# Patient Record
Sex: Female | Born: 1985
Health system: Southern US, Community
[De-identification: ages and names within clinical notes are randomized; demographics above are authoritative.]

## PROBLEM LIST (undated history)

## (undated) DIAGNOSIS — Z9189 Other specified personal risk factors, not elsewhere classified: Secondary | ICD-10-CM

## (undated) DIAGNOSIS — G35 Multiple sclerosis: Secondary | ICD-10-CM

## (undated) DIAGNOSIS — Z1379 Encounter for other screening for genetic and chromosomal anomalies: Secondary | ICD-10-CM

## (undated) DIAGNOSIS — Z9289 Personal history of other medical treatment: Secondary | ICD-10-CM

## (undated) DIAGNOSIS — Z800 Family history of malignant neoplasm of digestive organs: Secondary | ICD-10-CM

## (undated) HISTORY — DX: Personal history of other medical treatment: Z92.89

## (undated) HISTORY — DX: Encounter for other screening for genetic and chromosomal anomalies: Z13.79

## (undated) HISTORY — DX: Family history of malignant neoplasm of digestive organs: Z80.0

## (undated) HISTORY — DX: Other specified personal risk factors, not elsewhere classified: Z91.89

## (undated) HISTORY — PX: FOOT SURGERY: SHX648

---
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
## 2008-10-18 ENCOUNTER — Emergency Department: Payer: Self-pay | Admitting: Emergency Medicine

## 2013-08-24 ENCOUNTER — Ambulatory Visit: Payer: Self-pay | Admitting: Podiatry

## 2013-09-01 ENCOUNTER — Ambulatory Visit: Payer: Self-pay | Admitting: Podiatry

## 2013-09-22 ENCOUNTER — Ambulatory Visit: Payer: Self-pay | Admitting: Podiatry

## 2013-09-26 ENCOUNTER — Ambulatory Visit: Payer: Self-pay | Admitting: Podiatry

## 2013-12-01 ENCOUNTER — Emergency Department: Payer: Self-pay | Admitting: Emergency Medicine

## 2014-08-03 DIAGNOSIS — G35A Relapsing-remitting multiple sclerosis: Secondary | ICD-10-CM | POA: Insufficient documentation

## 2014-08-03 DIAGNOSIS — K5900 Constipation, unspecified: Secondary | ICD-10-CM | POA: Insufficient documentation

## 2014-08-03 DIAGNOSIS — G35 Multiple sclerosis: Secondary | ICD-10-CM | POA: Insufficient documentation

## 2014-09-19 DIAGNOSIS — R195 Other fecal abnormalities: Secondary | ICD-10-CM | POA: Insufficient documentation

## 2014-09-19 DIAGNOSIS — Z800 Family history of malignant neoplasm of digestive organs: Secondary | ICD-10-CM | POA: Insufficient documentation

## 2014-09-19 DIAGNOSIS — R194 Change in bowel habit: Secondary | ICD-10-CM | POA: Insufficient documentation

## 2015-05-10 ENCOUNTER — Encounter: Payer: Self-pay | Admitting: Urgent Care

## 2015-05-10 ENCOUNTER — Emergency Department
Admission: EM | Admit: 2015-05-10 | Discharge: 2015-05-10 | Disposition: A | Discharge status: Home / Self Care | Payer: 59 | Attending: Emergency Medicine | Admitting: Emergency Medicine

## 2015-05-10 DIAGNOSIS — T7840XA Allergy, unspecified, initial encounter: Secondary | ICD-10-CM | POA: Insufficient documentation

## 2015-05-10 DIAGNOSIS — X58XXXA Exposure to other specified factors, initial encounter: Secondary | ICD-10-CM | POA: Insufficient documentation

## 2015-05-10 DIAGNOSIS — Y9289 Other specified places as the place of occurrence of the external cause: Secondary | ICD-10-CM | POA: Diagnosis not present

## 2015-05-10 DIAGNOSIS — Z8669 Personal history of other diseases of the nervous system and sense organs: Secondary | ICD-10-CM | POA: Diagnosis not present

## 2015-05-10 DIAGNOSIS — R21 Rash and other nonspecific skin eruption: Secondary | ICD-10-CM | POA: Diagnosis present

## 2015-05-10 DIAGNOSIS — Y998 Other external cause status: Secondary | ICD-10-CM | POA: Diagnosis not present

## 2015-05-10 DIAGNOSIS — Y9389 Activity, other specified: Secondary | ICD-10-CM | POA: Insufficient documentation

## 2015-05-10 DIAGNOSIS — L500 Allergic urticaria: Secondary | ICD-10-CM | POA: Diagnosis not present

## 2015-05-10 DIAGNOSIS — L509 Urticaria, unspecified: Secondary | ICD-10-CM

## 2015-05-10 HISTORY — DX: Multiple sclerosis: G35

## 2015-05-10 MED ORDER — FAMOTIDINE 20 MG PO TABS
40.0000 mg | ORAL_TABLET | Freq: Once | ORAL | Status: AC
  Administered 2015-05-10: 40 mg via ORAL

## 2015-05-10 MED ORDER — FAMOTIDINE 20 MG PO TABS
ORAL_TABLET | ORAL | Status: AC
  Administered 2015-05-10: 40 mg via ORAL
  Filled 2015-05-10: qty 2

## 2015-05-10 MED ORDER — PREDNISONE 20 MG PO TABS
ORAL_TABLET | ORAL | Status: AC
  Administered 2015-05-10: 60 mg via ORAL
  Filled 2015-05-10: qty 3

## 2015-05-10 MED ORDER — PREDNISONE 20 MG PO TABS
60.0000 mg | ORAL_TABLET | Freq: Once | ORAL | Status: AC
  Administered 2015-05-10: 60 mg via ORAL

## 2015-05-10 MED ORDER — METHYLPREDNISOLONE 4 MG PO TBPK: ORAL_TABLET | ORAL | Status: DC

## 2015-05-10 MED ORDER — DIPHENHYDRAMINE HCL 25 MG PO CAPS
ORAL_CAPSULE | ORAL | Status: AC
  Filled 2015-05-10: qty 2

## 2015-05-10 MED ORDER — FAMOTIDINE 20 MG PO TABS: 20.0000 mg | ORAL_TABLET | Freq: Two times a day (BID) | ORAL | Status: DC

## 2015-05-10 MED ORDER — DIPHENHYDRAMINE HCL 50 MG PO CAPS: 50.0000 mg | ORAL_CAPSULE | Freq: Once | ORAL | Status: DC

## 2015-05-10 NOTE — ED Notes (Signed)
Patient presents with c/o a non-specific rash to her BLE. Patient advising that she appreciated some "bumps" yesterday afternoon around 1600, however didn't think anything of it until she woke up itching. NOS reported by patient at this time.

## 2015-05-10 NOTE — ED Notes (Signed)
Pt has hives on her thigh itching had 2 bumps yesterday got worse tonight at 1am. Horrible itching. No change in anything to explain rash.

## 2015-05-10 NOTE — ED Provider Notes (Signed)
[facility] Emergency Department Provider Note  ____________________________________________  Time seen: Approximately 6:37 AM  I have reviewed the triage vital signs and the nursing notes.   HISTORY  Chief Complaint Rash   HPI Brandy Krause is a 29 y.o. female , this morning with itching and found that she was covered in hives. Patient denies any difficulty swallowing or difficulty breathing. Patient denies any known new foods or medications or toiletries. Patient also says that since she's been in the ER, hives have gotten some better. Patient did not take any medication at home for the symptoms. Patient is not having any significant pain or any other associated symptoms.   Past Medical History  Diagnosis Date  . MS (multiple sclerosis)     There are no active problems to display for this patient.   History reviewed. No pertinent past surgical history.  Current Outpatient Rx  Name  Route  Sig  Dispense  Refill  . famotidine (PEPCID) 20 MG tablet   Oral   Take 1 tablet (20 mg total) by mouth 2 (two) times daily.   10 tablet   1   . methylPREDNISolone (MEDROL DOSEPAK) 4 MG TBPK tablet      Reaction   21 tablet   0     Allergies Review of patient's allergies indicates no known allergies.  No family history on file.  Social History History  Substance Use Topics  . Smoking status: Never Smoker   . Smokeless tobacco: Not on file  . Alcohol Use: No    Review of Systems Constitutional: No fever/chills Eyes: No visual changes. ENT: No sore throat. Cardiovascular: Denies chest pain. Respiratory: Denies shortness of breath. Gastrointestinal: No abdominal pain.  No nausea, no vomiting.  No diarrhea.  No constipation. Genitourinary: Negative for dysuria. Musculoskeletal: Negative for back pain. Skin: Patient with hives scattered on her trunk and extremities. Neurological: Negative for headaches, focal weakness or numbness.  10-point  ROS otherwise negative.  ____________________________________________   PHYSICAL EXAM:  VITAL SIGNS: ED Triage Vitals  Enc Vitals Group     BP 05/10/15 0311 134/97 mmHg     Pulse Rate 05/10/15 0311 93     Resp 05/10/15 0311 18     Temp 05/10/15 0311 98.6 F (37 C)     Temp Source 05/10/15 0311 Oral     SpO2 05/10/15 0311 100 %     Weight 05/10/15 0311 160 lb (72.576 kg)     Height 05/10/15 0311 5\' 5"  (1.651 m)     Head Cir --      Peak Flow --      Pain Score 05/10/15 0312 0     Pain Loc --      Pain Edu? --      Excl. in [place]? --     Constitutional: Alert and oriented. Well appearing and in no acute distress. Eyes: Conjunctivae are normal. PERRL. EOMI. Head: Atraumatic. Nose: No congestion/rhinnorhea. Mouth/Throat: Mucous membranes are moist.  Oropharynx non-erythematous. There is no posterior oropharyngeal swelling Neck: No stridor.   Cardiovascular: Normal rate, regular rhythm. Grossly normal heart sounds.  Good peripheral circulation. Respiratory: Normal respiratory effort.  No retractions. Lungs CTAB. Gastrointestinal: Soft and nontender. No distention. No abdominal bruits. No CVA tenderness. Musculoskeletal: No lower extremity tenderness nor edema.  No joint effusions. Neurologic:  Normal speech and language. No gross focal neurologic deficits are appreciated. Speech is normal. No gait instability. Skin:  Skin is warm, dry and intact. Scattered hives  primarily on her trunk but a few scattered on her upper thighs and forearms. Psychiatric: Mood and affect are normal. Speech and behavior are normal.  ____________________________________________   LABS (all labs ordered are listed, but only abnormal results are displayed)  Labs Reviewed - No data to display ____________________________________________  EKG  None ____________________________________________  RADIOLOGY  None ____________________________________________   PROCEDURES  Procedure(s) performed:  None  Critical Care performed: No  ____________________________________________   INITIAL IMPRESSION / ASSESSMENT AND PLAN / ED COURSE  Pertinent labs & imaging results that were available during my care of the patient were reviewed by me and considered in my medical decision making (see chart for details).  Patient will be given some Benadryl, prednisone and Pepcid prior to discharge. Patient has been using over-the-counter Benadryl every 4-6 hours as needed but will be given a Medrol Dosepak along with Pepcid for 5 days. Patient is to return immediately if condition worsens. ____________________________________________   FINAL CLINICAL IMPRESSION(S) / ED DIAGNOSES  Final diagnoses:  Hives  Allergic reaction, initial encounter      Ruby Cola, MD 05/10/15 [id number]

## 2016-05-17 DIAGNOSIS — Z1371 Encounter for nonprocreative screening for genetic disease carrier status: Secondary | ICD-10-CM

## 2016-05-17 DIAGNOSIS — Z9189 Other specified personal risk factors, not elsewhere classified: Secondary | ICD-10-CM

## 2016-05-17 HISTORY — DX: Encounter for nonprocreative screening for genetic disease carrier status: Z13.71

## 2016-05-17 HISTORY — DX: Other specified personal risk factors, not elsewhere classified: Z91.89

## 2016-06-30 DIAGNOSIS — Z800 Family history of malignant neoplasm of digestive organs: Secondary | ICD-10-CM

## 2016-06-30 HISTORY — DX: Family history of malignant neoplasm of digestive organs: Z80.0

## 2017-06-11 ENCOUNTER — Encounter: Payer: Self-pay | Admitting: Emergency Medicine

## 2017-06-11 ENCOUNTER — Ambulatory Visit
Admission: EM | Admit: 2017-06-11 | Discharge: 2017-06-11 | Disposition: A | Discharge status: Home / Self Care | Payer: 59 | Attending: Emergency Medicine | Admitting: Emergency Medicine

## 2017-06-11 DIAGNOSIS — F419 Anxiety disorder, unspecified: Secondary | ICD-10-CM | POA: Diagnosis not present

## 2017-06-11 DIAGNOSIS — R41840 Attention and concentration deficit: Secondary | ICD-10-CM

## 2017-06-11 HISTORY — DX: Multiple sclerosis: G35

## 2017-06-11 MED ORDER — CLONAZEPAM 0.5 MG PO TABS: 0.5000 mg | ORAL_TABLET | Freq: Two times a day (BID) | ORAL | 0 refills | Status: DC | PRN

## 2017-06-11 NOTE — ED Provider Notes (Signed)
CSN: [id number]     Arrival date & time 06/11/17  1716 History   First MD Initiated Contact with Patient 06/11/17 1806     Chief Complaint  Patient presents with  . Anxiety   (Consider location/radiation/quality/duration/timing/severity/associated sxs/prior Treatment) HPI  This is a 31 year old female presents with feelings of anxiousness and difficulty focusing. States that she has had to drop out of several classes cut she cannot focus on the work and also does not adhere to deadlines well. She states that she will block out conversations; she is unable to concentrate exactly what the person is saying. She  does not have depression ,denies any thoughts of self-harm or homicide. She appears anxious during the interview. He is upset  with herself over her inability to take classes in order to improve herself. He is in a monogamous steady relationship. Is not having troubles at home. She has a history of multiple sclerosis and was on treatment mostly taking it when she had relapses. She discontinued the medication about 10 years ago.       Past Medical History:  Diagnosis Date  . MS (multiple sclerosis) ([place])   . Multiple sclerosis ([place])    Past Surgical History:  Procedure Laterality Date  . WISDOM TOOTH EXTRACTION     Family History  Problem Relation Age of Onset  . Cancer Mother   . Cancer Father    Social History  Substance Use Topics  . Smoking status: Never Smoker  . Smokeless tobacco: Never Used  . Alcohol use No   OB History    No data available     Review of Systems  Constitutional: Positive for activity change. Negative for chills, fatigue and fever.  Psychiatric/Behavioral: Positive for decreased concentration. Negative for self-injury, sleep disturbance and suicidal ideas. The patient is nervous/anxious and is hyperactive.   All other systems reviewed and are negative.   Allergies  Patient has no known allergies.  Home Medications   Prior to Admission  medications   Medication Sig Start Date End Date Taking? Authorizing Provider  levonorgestrel (MIRENA) 20 MCG/24HR IUD 1 each by Intrauterine route once.   Yes [provider]  clonazePAM (KLONOPIN) 0.5 MG tablet Take 1 tablet (0.5 mg total) by mouth 2 (two) times daily as needed for anxiety. 06/11/17   Lorin Picket, PA-C   Meds Ordered and Administered this Visit  Medications - No data to display  BP 131/89 (BP Location: Right Arm)   Pulse 80   Temp 98.2 F (36.8 C) (Oral)   Resp 16   Ht 5\' 5"  (1.651 m)   Wt 170 lb (77.1 kg)   SpO2 100%   BMI 28.29 kg/m  No data found.   Physical Exam  Constitutional: She is oriented to person, place, and time. She appears well-developed and well-nourished. No distress.  HENT:  Head: Normocephalic.  Eyes: Pupils are equal, round, and reactive to light. Right eye exhibits no discharge. Left eye exhibits no discharge.  Neck: Normal range of motion.  Cardiovascular: Normal rate and regular rhythm.   Pulmonary/Chest: Effort normal and breath sounds normal.  Musculoskeletal: Normal range of motion.  Neurological: She is alert and oriented to person, place, and time.  Skin: Skin is warm and dry. She is not diaphoretic.  Psychiatric: She has a normal mood and affect. Her behavior is normal. Judgment and thought content normal.  Nursing note and vitals reviewed.   Urgent Care Course     Procedures (including critical care time)  Labs Review Labs Reviewed - No data to display  Imaging Review No results found.   Visual Acuity Review  Right Eye Distance:   Left Eye Distance:   Bilateral Distance:    Right Eye Near:   Left Eye Near:    Bilateral Near:         MDM   1. Anxiety    Discharge Medication List as of 06/11/2017  6:05 PM    START taking these medications   Details  clonazePAM (KLONOPIN) 0.5 MG tablet Take 1 tablet (0.5 mg total) by mouth 2 (two) times daily as needed for anxiety., Starting Thu 06/11/2017,  Print      Plan: 1. Test/x-ray results and diagnosis reviewed with patient 2. rx as per orders; risks, benefits, potential side effects reviewed with patient 3. Recommend supportive treatment with Use of medication only as necessary. I have recommended that she follow-up with a primary care physician I for ongoing evaluation and treatment. Will require repeated close follow-ups which we do not provide here. I've given her the name of a primary care physician  in the area but have encouraged her to contact her insurance company to see if they are participant in her insurance. 4. F/u prn if symptoms worsen or don't improve     Lorin Picket, PA-C 06/11/17 1829

## 2017-06-11 NOTE — ED Triage Notes (Signed)
Patient c/o feeling anxious and having trouble focusing that has gotten worse over the past month.

## 2017-07-16 ENCOUNTER — Encounter: Payer: Self-pay | Admitting: Family Medicine

## 2017-07-16 ENCOUNTER — Ambulatory Visit (INDEPENDENT_AMBULATORY_CARE_PROVIDER_SITE_OTHER): Payer: 59 | Admitting: Family Medicine

## 2017-07-16 ENCOUNTER — Telehealth: Payer: Self-pay

## 2017-07-16 VITALS — BP 96/58 | HR 95 | Temp 98.2°F | Resp 16 | Ht 65.0 in | Wt 182.0 lb

## 2017-07-16 DIAGNOSIS — F419 Anxiety disorder, unspecified: Secondary | ICD-10-CM | POA: Diagnosis not present

## 2017-07-16 DIAGNOSIS — F900 Attention-deficit hyperactivity disorder, predominantly inattentive type: Secondary | ICD-10-CM

## 2017-07-16 DIAGNOSIS — F329 Major depressive disorder, single episode, unspecified: Secondary | ICD-10-CM

## 2017-07-16 DIAGNOSIS — F32A Depression, unspecified: Secondary | ICD-10-CM

## 2017-07-16 MED ORDER — SERTRALINE HCL 50 MG PO TABS: 50.0000 mg | ORAL_TABLET | Freq: Every day | ORAL | 3 refills | Status: DC

## 2017-07-16 NOTE — Telephone Encounter (Signed)
Please set up referral for patient to see psychiatrist per Dr. Ronnald Ramp request.

## 2017-07-16 NOTE — Progress Notes (Signed)
Name: Brandy Krause   MRN: [id number]    DOB: September 20, 1986   Date:07/16/2017       Progress Note  Subjective  Chief Complaint  Chief Complaint  Patient presents with  . Establish Care  . concentration    trouble focuing  . Depression    Patient with concerns of ADHD. Concerns about inability to concentrate and complete cognitive task.  Possible hyperteactive.  Questionable life goals and future aspiration.  Start courses and not complete.   Depression         This is a chronic problem.  The current episode started more than 1 year ago.   The onset quality is sudden.   The problem occurs intermittently.  The problem has been gradually worsening since onset.  Associated symptoms include decreased concentration, fatigue and decreased interest.  Associated symptoms include no helplessness, no hopelessness, does not have insomnia, not irritable, no restlessness, no appetite change, no myalgias, no headaches, not sad and no suicidal ideas.     The symptoms are aggravated by family issues.  Past treatments include nothing.  Past medical history includes anxiety.     Pertinent negatives include no physical disability, no eating disorder, no mental health disorder, no obsessive-compulsive disorder and no post-traumatic stress disorder. Anxiety  Presents for follow-up visit. Symptoms include decreased concentration and nervous/anxious behavior. Patient reports no chest pain, compulsions, confusion, depressed mood, dizziness, dry mouth, excessive worry, feeling of choking, impotence, insomnia, irritability, malaise, nausea, obsessions, palpitations, panic, restlessness, shortness of breath or suicidal ideas. Symptoms occur occasionally. The severity of symptoms is moderate. The quality of sleep is good.      No problem-specific Assessment & Plan notes found for this encounter.   Past Medical History:  Diagnosis Date  . MS (multiple sclerosis) ([place])   . Multiple sclerosis ([place])     Past Surgical  History:  Procedure Laterality Date  . WISDOM TOOTH EXTRACTION      Family History  Problem Relation Age of Onset  . Cancer Mother   . Cancer Father     Social History   Social History  . Marital status: Single    Spouse name: N/A  . Number of children: 0  . Years of education: N/A   Occupational History  . Shipper    Social History Main Topics  . Smoking status: Never Smoker  . Smokeless tobacco: Never Used  . Alcohol use No  . Drug use: No  . Sexual activity: Yes    Birth control/ protection: IUD   Other Topics Concern  . Not on file   Social History Narrative  . No narrative on file    No Known Allergies  Outpatient Medications Prior to Visit  Medication Sig Dispense Refill  . levonorgestrel (MIRENA) 20 MCG/24HR IUD 1 each by Intrauterine route once.    . clonazePAM (KLONOPIN) 0.5 MG tablet Take 1 tablet (0.5 mg total) by mouth 2 (two) times daily as needed for anxiety. 14 tablet 0   No facility-administered medications prior to visit.     Review of Systems  Constitutional: Positive for fatigue. Negative for appetite change, chills, fever, irritability, malaise/fatigue and weight loss.  HENT: Negative for ear discharge, ear pain and sore throat.   Eyes: Negative for blurred vision.  Respiratory: Negative for cough, sputum production, shortness of breath and wheezing.   Cardiovascular: Negative for chest pain, palpitations and leg swelling.  Gastrointestinal: Negative for abdominal pain, blood in stool, constipation, diarrhea, heartburn, melena and nausea.  Genitourinary: Negative for dysuria, frequency, hematuria, impotence and urgency.  Musculoskeletal: Negative for back pain, joint pain, myalgias and neck pain.  Skin: Negative for rash.  Neurological: Negative for dizziness, tingling, sensory change, focal weakness and headaches.  Endo/Heme/Allergies: Negative for environmental allergies and polydipsia. Does not bruise/bleed easily.   Psychiatric/Behavioral: Positive for decreased concentration and depression. Negative for confusion, hallucinations, memory loss, substance abuse and suicidal ideas. The patient is nervous/anxious. The patient does not have insomnia.      Objective  Vitals:   07/16/17 1432  BP: (!) 96/58  Pulse: 95  Resp: 16  Temp: 98.2 F (36.8 C)  TempSrc: Oral  SpO2: 96%  Weight: 182 lb (82.6 kg)  Height: 5\' 5"  (1.651 m)    Physical Exam  Constitutional: She is well-developed, well-nourished, and in no distress. She is not irritable. No distress.  HENT:  Head: Normocephalic and atraumatic.  Right Ear: External ear normal.  Left Ear: External ear normal.  Nose: Nose normal.  Mouth/Throat: Oropharynx is clear and moist.  Eyes: Pupils are equal, round, and reactive to light. Conjunctivae and EOM are normal. Right eye exhibits no discharge. Left eye exhibits no discharge.  Neck: Normal range of motion. Neck supple. No JVD present. No thyromegaly present.  Cardiovascular: Normal rate, regular rhythm, S1 normal, S2 normal, normal heart sounds and intact distal pulses.  Exam reveals no gallop, no S3, no S4 and no friction rub.   No murmur heard. Pulmonary/Chest: Effort normal and breath sounds normal. She has no wheezes. She has no rales.  Abdominal: Soft. Bowel sounds are normal. She exhibits no mass. There is no tenderness. There is no guarding.  Musculoskeletal: Normal range of motion. She exhibits no edema.  Lymphadenopathy:    She has no cervical adenopathy.  Neurological: She is alert. She has normal reflexes.  Skin: Skin is warm and dry. She is not diaphoretic.  Psychiatric: Mood and affect normal.  Nursing note and vitals reviewed.     Assessment & Plan  Problem List Items Addressed This Visit    None    Visit Diagnoses    Anxiety and depression    -  Primary   start sertraline 25mg  (one half tablet) then 1 a day   Relevant Medications   sertraline (ZOLOFT) 50 MG tablet    Other Relevant Orders   Ambulatory referral to Psychiatry   ADHD, predominantly inattentive type       pending evaluation   Relevant Medications   sertraline (ZOLOFT) 50 MG tablet   Other Relevant Orders   Ambulatory referral to Psychiatry      Meds ordered this encounter  Medications  . sertraline (ZOLOFT) 50 MG tablet    Sig: Take 1 tablet (50 mg total) by mouth daily.    Dispense:  30 tablet    Refill:  3      Dr. Mac[facility]  07/16/17

## 2017-07-17 ENCOUNTER — Other Ambulatory Visit: Payer: Self-pay

## 2017-07-21 NOTE — Telephone Encounter (Signed)
Referral was put in  

## 2017-08-27 ENCOUNTER — Ambulatory Visit: Payer: 59 | Admitting: Family Medicine

## 2017-09-13 ENCOUNTER — Ambulatory Visit
Admission: EM | Admit: 2017-09-13 | Discharge: 2017-09-13 | Disposition: A | Discharge status: Home / Self Care | Payer: 59 | Attending: Family Medicine | Admitting: Family Medicine

## 2017-09-13 DIAGNOSIS — R350 Frequency of micturition: Secondary | ICD-10-CM

## 2017-09-13 DIAGNOSIS — R319 Hematuria, unspecified: Secondary | ICD-10-CM

## 2017-09-13 DIAGNOSIS — N390 Urinary tract infection, site not specified: Secondary | ICD-10-CM

## 2017-09-13 DIAGNOSIS — Z3202 Encounter for pregnancy test, result negative: Secondary | ICD-10-CM

## 2017-09-13 DIAGNOSIS — R300 Dysuria: Secondary | ICD-10-CM

## 2017-09-13 DIAGNOSIS — Z113 Encounter for screening for infections with a predominantly sexual mode of transmission: Secondary | ICD-10-CM | POA: Diagnosis not present

## 2017-09-13 DIAGNOSIS — N898 Other specified noninflammatory disorders of vagina: Secondary | ICD-10-CM

## 2017-09-13 LAB — URINALYSIS, COMPLETE (UACMP) WITH MICROSCOPIC
BILIRUBIN URINE: NEGATIVE
GLUCOSE, UA: NEGATIVE mg/dL
Ketones, ur: NEGATIVE mg/dL
NITRITE: NEGATIVE
PH: 6.5 (ref 5.0–8.0)
Protein, ur: NEGATIVE mg/dL
RBC / HPF: NONE SEEN RBC/hpf (ref 0–5)
SPECIFIC GRAVITY, URINE: 1.025 (ref 1.005–1.030)

## 2017-09-13 LAB — WET PREP, GENITAL
Clue Cells Wet Prep HPF POC: NONE SEEN
Sperm: NONE SEEN
TRICH WET PREP: NONE SEEN
YEAST WET PREP: NONE SEEN

## 2017-09-13 LAB — PREGNANCY, URINE: Preg Test, Ur: NEGATIVE

## 2017-09-13 MED ORDER — FLUCONAZOLE 150 MG PO TABS: 150.0000 mg | ORAL_TABLET | Freq: Every day | ORAL | 0 refills | Status: DC

## 2017-09-13 MED ORDER — CEPHALEXIN 500 MG PO CAPS
500.0000 mg | ORAL_CAPSULE | Freq: Two times a day (BID) | ORAL | 0 refills | Status: AC
Start: 2017-09-13 — End: 2017-09-20

## 2017-09-13 NOTE — ED Triage Notes (Signed)
Pt reports "awhile" of urinary frequency and itchy vaginal discharge intermittently. Endorses unprotected sex. Denies pain

## 2017-09-13 NOTE — Discharge Instructions (Signed)
Take medication as prescribed. Rest. Drink plenty of fluids.  ° °Follow up with your primary care physician this week as needed. Return to Urgent care for new or worsening concerns.  ° °

## 2017-09-13 NOTE — ED Provider Notes (Signed)
[facility] ____________________________________________  Time seen: Approximately 1:50 PM  I have reviewed the triage vital signs and the nursing notes.   HISTORY  Chief Complaint Urinary Frequency   HPI Brandy Krause is a 31 y.o. female presented for evaluation of urinary frequency and urgency that is been present for approximately 1 week.  Patient also reports noticing some intermittent itchy vaginal discharge that is thick and white in color.  Patient reports that she is currently sexually active with one partner, declines any recent partner changes, and states that she has not really concerned of STDs, but consents to gonorrhea and Chlamydia testing.  Patient denies vaginal pain, vaginal rash, skin changes, abdominal pain, back pain, fevers, nausea, vomiting or diarrhea.  Reports continues to eat and drink well.  Declines concerns of pregnancy.  Has not taken any over-the-counter medications for the same complaints.Denies chest pain, shortness of breath, abdominal pain, or rash. Denies recent sickness. Denies recent antibiotic use.   Juline Patch, MD: PCP    Past Medical History:  Diagnosis Date  . MS (multiple sclerosis) ([place])   . Multiple sclerosis ([place])     There are no active problems to display for this patient.   Past Surgical History:  Procedure Laterality Date  . WISDOM TOOTH EXTRACTION       No current facility-administered medications for this encounter.   Current Outpatient Prescriptions:  .  cephALEXin (KEFLEX) 500 MG capsule, Take 1 capsule (500 mg total) by mouth 2 (two) times daily., Disp: 14 capsule, Rfl: 0 .  fluconazole (DIFLUCAN) 150 MG tablet, Take 1 tablet (150 mg total) by mouth daily. Take one pill orally, then Repeat in72 hours as needed., Disp: 2 tablet, Rfl: 0 .  levonorgestrel (MIRENA) 20 MCG/24HR IUD, 1 each by Intrauterine route once., Disp: , Rfl:  .  sertraline (ZOLOFT) 50 MG tablet, Take 1 tablet (50 mg total) by mouth  daily., Disp: 30 tablet, Rfl: 3  Allergies Patient has no known allergies.  Family History  Problem Relation Age of Onset  . Cancer Mother   . Cancer Father     Social History Social History  Substance Use Topics  . Smoking status: Never Smoker  . Smokeless tobacco: Never Used  . Alcohol use No    Review of Systems Constitutional: No fever/chills Cardiovascular: Denies chest pain. Respiratory: Denies shortness of breath. Gastrointestinal: No abdominal pain.  No nausea, no vomiting.  No diarrhea.   Genitourinary: positive for dysuria. Musculoskeletal: Negative for back pain. Skin: Negative for rash.  ____________________________________________   PHYSICAL EXAM:  VITAL SIGNS: ED Triage Vitals  Enc Vitals Group     BP 09/13/17 1254 135/85     Pulse Rate 09/13/17 1254 78     Resp 09/13/17 1254 18     Temp 09/13/17 1254 98.1 F (36.7 C)     Temp Source 09/13/17 1254 Oral     SpO2 09/13/17 1254 100 %     Weight 09/13/17 1251 180 lb (81.6 kg)     Height 09/13/17 1251 5\' 5"  (1.651 m)     Head Circumference --      Peak Flow --      Pain Score 09/13/17 1428 3     Pain Loc --      Pain Edu? --      Excl. in [place]? --     Constitutional: Alert and oriented. Well appearing and in no acute distress. Cardiovascular: Normal rate, regular rhythm. Grossly normal heart sounds.  Good peripheral circulation. Respiratory: Normal respiratory effort without tachypnea nor retractions. Breath sounds are clear and equal bilaterally. No wheezes, rales, rhonchi. Gastrointestinal: Soft and nontender. No distention.No CVA tenderness. Pelvic: completed by [facility] student with myself at bedside. [facility]tsy RN also at bedside.  External: Normal appearance, no rash or lesion.  Speculum: Moderate amount of white thick vaginal discharge, no bleeding, no foreign bodies, unable to visualize IUD strings through vaginal discharge.  Musculoskeletal:  No midline cervical, thoracic or lumbar tenderness to  palpation.  Neurologic:  Normal speech and language. Speech is normal. No gait instability.  Skin:  Skin is warm, dry Psychiatric: Mood and affect are normal. Speech and behavior are normal. Patient exhibits appropriate insight and judgment   ___________________________________________   LABS (all labs ordered are listed, but only abnormal results are displayed)  Labs Reviewed  WET PREP, GENITAL - Abnormal; Notable for the following:       Result Value   WBC, Wet Prep HPF POC MANY (*)    All other components within normal limits  URINALYSIS, COMPLETE (UACMP) WITH MICROSCOPIC - Abnormal; Notable for the following:    APPearance CLOUDY (*)    Hgb urine dipstick TRACE (*)    Leukocytes, UA SMALL (*)    Squamous Epithelial / LPF 6-30 (*)    Bacteria, UA MANY (*)    All other components within normal limits  CHLAMYDIA/NGC RT PCR ([facility] ONLY)  URINE CULTURE  PREGNANCY, URINE    PROCEDURES Procedures    INITIAL IMPRESSION / ASSESSMENT AND PLAN / ED COURSE  Pertinent labs & imaging results that were available during my care of the patient were reviewed by me and considered in my medical decision making (see chart for details).  Well-appearing patient.  No acute distress.  Urinalysis and wet prep reviewed.  UTI.  Also concern for yeast vaginitis.  Will treat patient with oral Keflex and Diflucan.  Encouraged rest, fluids, supportive care, pelvic rest and PCP follow-up.Discussed indication, risks and benefits of medications with patient.  Discussed follow up with Primary care physician this week. Discussed follow up and return parameters including no resolution or any worsening concerns. Patient verbalized understanding and agreed to plan.   ____________________________________________   FINAL CLINICAL IMPRESSION(S) / ED DIAGNOSES  Final diagnoses:  Urinary tract infection with hematuria, site unspecified  Vaginal discharge     Discharge Medication List as of  09/13/2017  2:23 PM    START taking these medications   Details  cephALEXin (KEFLEX) 500 MG capsule Take 1 capsule (500 mg total) by mouth 2 (two) times daily., Starting Sun 09/13/2017, Until Sun 09/20/2017, Normal    fluconazole (DIFLUCAN) 150 MG tablet Take 1 tablet (150 mg total) by mouth daily. Take one pill orally, then Repeat in72 hours as needed., Starting Sun 09/13/2017, Normal        Note: This dictation was prepared with Dragon dictation along with smaller phrase technology. Any transcriptional errors that result from this process are unintentional.         Brandy Land, NP 09/13/17 1537

## 2017-09-14 LAB — CHLAMYDIA/NGC RT PCR (ARMC ONLY)
CHLAMYDIA TR: NOT DETECTED
N gonorrhoeae: NOT DETECTED

## 2017-09-16 LAB — URINE CULTURE: Culture: >=100000 — AB

## 2017-12-14 ENCOUNTER — Encounter: Payer: Self-pay | Admitting: Podiatry

## 2017-12-14 ENCOUNTER — Ambulatory Visit (INDEPENDENT_AMBULATORY_CARE_PROVIDER_SITE_OTHER): Payer: 59

## 2017-12-14 ENCOUNTER — Ambulatory Visit (INDEPENDENT_AMBULATORY_CARE_PROVIDER_SITE_OTHER): Payer: 59 | Admitting: Podiatry

## 2017-12-14 VITALS — BP 126/62 | HR 86 | Resp 16

## 2017-12-14 DIAGNOSIS — Q828 Other specified congenital malformations of skin: Secondary | ICD-10-CM

## 2017-12-14 DIAGNOSIS — M2011 Hallux valgus (acquired), right foot: Secondary | ICD-10-CM

## 2017-12-14 DIAGNOSIS — M216X1 Other acquired deformities of right foot: Secondary | ICD-10-CM | POA: Diagnosis not present

## 2017-12-14 NOTE — Patient Instructions (Signed)
Pre-Operative Instructions  Congratulations, you have decided to take an important step towards improving your quality of life.  You can be assured that the doctors and staff at [facility] will be with you every step of the way.  Here are some important things you should know:  1. Plan to be at the surgery center/hospital at least 1 (one) hour prior to your scheduled time, unless otherwise directed by the surgical center/hospital staff.  You must have a responsible adult accompany you, remain during the surgery and drive you home.  Make sure you have directions to the surgical center/hospital to ensure you arrive on time. 2. If you are having surgery at [facility] or [facility] hospitals, you will need a copy of your medical history and physical form from your family physician within one month prior to the date of surgery. We will give you a form for your primary physician to complete.  3. We make every effort to accommodate the date you request for surgery.  However, there are times where surgery dates or times have to be moved.  We will contact you as soon as possible if a change in schedule is required.   4. No aspirin/ibuprofen for one week before surgery.  If you are on aspirin, any non-steroidal anti-inflammatory medications (Mobic, Aleve, Ibuprofen) should not be taken seven (7) days prior to your surgery.  You make take Tylenol for pain prior to surgery.  5. Medications - If you are taking daily heart and blood pressure medications, seizure, reflux, allergy, asthma, anxiety, pain or diabetes medications, make sure you notify the surgery center/hospital before the day of surgery so they can tell you which medications you should take or avoid the day of surgery. 6. No food or drink after midnight the night before surgery unless directed otherwise by surgical center/hospital staff. 7. No alcoholic beverages 24-hours prior to surgery.  No smoking 24-hours prior or 24-hours after  surgery. 8. Wear loose pants or shorts. They should be loose enough to fit over bandages, boots, and casts. 9. Don't wear slip-on shoes. Sneakers are preferred. 10. Bring your boot with you to the surgery center/hospital.  Also bring crutches or a walker if your physician has prescribed it for you.  If you do not have this equipment, it will be provided for you after surgery. 11. If you have not been contacted by the surgery center/hospital by the day before your surgery, call to confirm the date and time of your surgery. 12. Leave-time from work may vary depending on the type of surgery you have.  Appropriate arrangements should be made prior to surgery with your employer. 13. Prescriptions will be provided immediately following surgery by your doctor.  Fill these as soon as possible after surgery and take the medication as directed. Pain medications will not be refilled on weekends and must be approved by the doctor. 14. Remove nail polish on the operative foot and avoid getting pedicures prior to surgery. 15. Wash the night before surgery.  The night before surgery wash the foot and leg well with water and the antibacterial soap provided. Be sure to pay special attention to beneath the toenails and in between the toes.  Wash for at least three (3) minutes. Rinse thoroughly with water and dry well with a towel.  Perform this wash unless told not to do so by your physician.  Enclosed: 1 Ice pack (please put in freezer the night before surgery)   1 Hibiclens skin cleaner     Pre-op instructions  If you have any questions regarding the instructions, please do not hesitate to call our office.  [place]: [address], [place], [place] [address] -- [phone]  [place]: [address] [place], [place] [address] -- [phone]  [place]: [address]  , [place] [address] -- [phone]  [place]: [address], [address], [place], [place] [address] -- [phone]  Website:  [email or web address] 

## 2017-12-14 NOTE — Progress Notes (Signed)
Subjective:  Patient ID: Brandy Krause, female    DOB: 27-Feb-1986,  MRN: [id number] HPI Chief Complaint  Patient presents with  . Foot Pain    1st MPJ right - aching x 1 year, getting more painful while at work, certain shoes uncomfortable, plantar forefoot callus too    32 y.o. female presents with the above complaint.     Past Medical History:  Diagnosis Date  . MS (multiple sclerosis) ([place])   . Multiple sclerosis ([place])    Past Surgical History:  Procedure Laterality Date  . WISDOM TOOTH EXTRACTION      Current Outpatient Medications:  .  levonorgestrel (MIRENA) 20 MCG/24HR IUD, 1 each by Intrauterine route once., Disp: , Rfl:  .  sertraline (ZOLOFT) 50 MG tablet, Take 1 tablet (50 mg total) by mouth daily., Disp: 30 tablet, Rfl: 3  No Known Allergies Review of Systems  All other systems reviewed and are negative.  Objective:   Vitals:   12/14/17 1542  BP: 126/62  Pulse: 86  Resp: 16    General: Well developed, nourished, in no acute distress, alert and oriented x3   Dermatological: Skin is warm, dry and supple bilateral. Nails x 10 are well maintained; remaining integument appears unremarkable at this time. There are no open sores, no preulcerative lesions, no rash or signs of infection present.  She does have a small greater than 1 cm callused lesion beneath the fourth metatarsal head of the right foot.  This is consistent with a poor keratoma rather than a verruca.  No thrombosed capillaries are visible skin lines do not circumvent the lesion.  No open lesions are noted.  Vascular: Dorsalis Pedis artery and Posterior Tibial artery pedal pulses are 2/4 bilateral with immedate capillary fill time. Pedal hair growth present. No varicosities and no lower extremity edema present bilateral.   Neruologic: Grossly intact via light touch bilateral. Vibratory intact via tuning fork bilateral. Protective threshold with Semmes Wienstein monofilament intact to all pedal sites  bilateral. Patellar and Achilles deep tendon reflexes 2+ bilateral. No Babinski or clonus noted bilateral.   Musculoskeletal: No gross boney pedal deformities bilateral. No pain, crepitus, or limitation noted with foot and ankle range of motion bilateral. Muscular strength 5/5 in all groups tested bilateral.  Pain and guarding on range of motion of the first metatarsophalangeal joint of the right foot.  Hallux valgus is noted medial aspect of the foot does not appear to be painful however any range of motion of the metatarsophalangeal joint is painful.  She also has some tenderness on palpation and range of motion of the second metatarsal phalangeal joint there is no swelling no open lesions or wounds noted.  Gait: Unassisted, Nonantalgic.    Radiographs:  Radiographs of the right foot were taken today 3 views.  No acute injury was noted.  She does however demonstrate elongated elevated first metatarsal resulting in some dorsal spurring joint space narrowing and hallux valgus.  She also has a long second metatarsal.  Assessment & Plan:   Assessment: Hallux abductovalgus deformity with hallux limitus first metatarsophalangeal joint right.  Elongated plantarflexed second metatarsal right.  Painful porokeratotic lesion plantar aspect of the fourth metatarsal of the right foot.  Plan: We discussed the etiology pathology conservative versus surgical therapies.  Today I debrided the reactive hyperkeratosis for her.  This will provide relief for quite some time.  Also we did discuss the need for surgical intervention since her foot is limiting her ability to perform her  daily activities.  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  We discussed surgical intervention she would like to have this direction.  We consented her today for an Baltazar Apo like osteotomy with screw fixation and a second metatarsal osteotomy with  screw fixation.  We also discussed excision of the soft tissue lesion.  At this point we discussed the possible postop complications which may include but are not limited to postop pain bleeding swelling infection recurrence need for further surgery overcorrection under correction loss of digit loss of limb loss of life.  I answered all the questions regarding this procedure to the best of my ability in layman's terms.  We dispensed a packet of paperwork which included information regarding the surgery center and anesthesia.  We also dispensed a Cam walker today as well as instructions.  I will follow-up with her in the near future for surgical intervention.     Max T. Murray, [place]

## 2017-12-28 ENCOUNTER — Other Ambulatory Visit: Payer: Self-pay

## 2017-12-28 DIAGNOSIS — L218 Other seborrheic dermatitis: Secondary | ICD-10-CM | POA: Diagnosis not present

## 2017-12-28 DIAGNOSIS — L658 Other specified nonscarring hair loss: Secondary | ICD-10-CM | POA: Diagnosis not present

## 2017-12-31 ENCOUNTER — Other Ambulatory Visit: Payer: Self-pay | Admitting: Podiatry

## 2017-12-31 ENCOUNTER — Encounter: Payer: Self-pay | Admitting: Psychiatry

## 2017-12-31 ENCOUNTER — Ambulatory Visit (INDEPENDENT_AMBULATORY_CARE_PROVIDER_SITE_OTHER): Payer: 59 | Admitting: Psychiatry

## 2017-12-31 ENCOUNTER — Other Ambulatory Visit: Payer: Self-pay

## 2017-12-31 VITALS — BP 118/79 | HR 89 | Temp 98.2°F | Wt 187.6 lb

## 2017-12-31 DIAGNOSIS — F411 Generalized anxiety disorder: Secondary | ICD-10-CM

## 2017-12-31 DIAGNOSIS — R41840 Attention and concentration deficit: Secondary | ICD-10-CM | POA: Diagnosis not present

## 2017-12-31 MED ORDER — BUSPIRONE HCL 5 MG PO TABS: 5.0000 mg | ORAL_TABLET | Freq: Two times a day (BID) | ORAL | 1 refills | Status: DC

## 2017-12-31 MED ORDER — OXYCODONE-ACETAMINOPHEN 10-325 MG PO TABS: 1.0000 | ORAL_TABLET | ORAL | 0 refills | Status: DC | PRN

## 2017-12-31 MED ORDER — CEPHALEXIN 500 MG PO CAPS: 500.0000 mg | ORAL_CAPSULE | Freq: Three times a day (TID) | ORAL | 0 refills | Status: DC

## 2017-12-31 MED ORDER — PROMETHAZINE HCL 25 MG PO TABS: 25.0000 mg | ORAL_TABLET | Freq: Three times a day (TID) | ORAL | 0 refills | Status: DC | PRN

## 2017-12-31 MED ORDER — BUPROPION HCL 75 MG PO TABS: 75.0000 mg | ORAL_TABLET | Freq: Every day | ORAL | 1 refills | Status: DC

## 2017-12-31 NOTE — Patient Instructions (Signed)
Buspirone tablets What is this medicine? BUSPIRONE (byoo SPYE rone) is used to treat anxiety disorders. This medicine may be used for other purposes; ask your health care provider or pharmacist if you have questions. COMMON BRAND NAME(S): BuSpar What should I tell my health care provider before I take this medicine? They need to know if you have any of these conditions: -kidney or liver disease -an unusual or allergic reaction to buspirone, other medicines, foods, dyes, or preservatives -pregnant or trying to get pregnant -breast-feeding How should I use this medicine? Take this medicine by mouth with a glass of water. Follow the directions on the prescription label. You may take this medicine with or without food. To ensure that this medicine always works the same way for you, you should take it either always with or always without food. Take your doses at regular intervals. Do not take your medicine more often than directed. Do not stop taking except on the advice of your doctor or health care professional. Talk to your pediatrician regarding the use of this medicine in children. Special care may be needed. Overdosage: If you think you have taken too much of this medicine contact a poison control center or emergency room at once. NOTE: This medicine is only for you. Do not share this medicine with others. What if I miss a dose? If you miss a dose, take it as soon as you can. If it is almost time for your next dose, take only that dose. Do not take double or extra doses. What may interact with this medicine? Do not take this medicine with any of the following medications: -linezolid -MAOIs like Carbex, Eldepryl, Marplan, Nardil, and Parnate -methylene blue -procarbazine This medicine may also interact with the following medications: -diazepam -digoxin -diltiazem -erythromycin -grapefruit juice -haloperidol -medicines for mental depression or mood problems -medicines for seizures like  carbamazepine, phenobarbital and phenytoin -nefazodone -other medications for anxiety -rifampin -ritonavir -some antifungal medicines like itraconazole, ketoconazole, and voriconazole -verapamil -warfarin This list may not describe all possible interactions. Give your health care provider a list of all the medicines, herbs, non-prescription drugs, or dietary supplements you use. Also tell them if you smoke, drink alcohol, or use illegal drugs. Some items may interact with your medicine. What should I watch for while using this medicine? Visit your doctor or health care professional for regular checks on your progress. It may take 1 to 2 weeks before your anxiety gets better. You may get drowsy or dizzy. Do not drive, use machinery, or do anything that needs mental alertness until you know how this drug affects you. Do not stand or sit up quickly, especially if you are an older patient. This reduces the risk of dizzy or fainting spells. Alcohol can make you more drowsy and dizzy. Avoid alcoholic drinks. What side effects may I notice from receiving this medicine? Side effects that you should report to your doctor or health care professional as soon as possible: -blurred vision or other vision changes -chest pain -confusion -difficulty breathing -feelings of hostility or anger -muscle aches and pains -numbness or tingling in hands or feet -ringing in the ears -skin rash and itching -vomiting -weakness Side effects that usually do not require medical attention (report to your doctor or health care professional if they continue or are bothersome): -disturbed dreams, nightmares -headache -nausea -restlessness or nervousness -sore throat and nasal congestion -stomach upset This list may not describe all possible side effects. Call your doctor for medical advice about side   effects. You may report side effects to FDA at 1-800-FDA-1088. Where should I keep my medicine? Keep out of the reach  of children. Store at room temperature below 30 degrees C (86 degrees F). Protect from light. Keep container tightly closed. Throw away any unused medicine after the expiration date. NOTE: This sheet is a summary. It may not cover all possible information. If you have questions about this medicine, talk to your doctor, pharmacist, or health care provider.  2018 Elsevier/Gold Standard (2010-06-13 18:06:11) Bupropion tablets (Depression/Mood Disorders) What is this medicine? BUPROPION (byoo PROE pee on) is used to treat depression. This medicine may be used for other purposes; ask your health care provider or pharmacist if you have questions. COMMON BRAND NAME(S): Wellbutrin What should I tell my health care provider before I take this medicine? They need to know if you have any of these conditions: -an eating disorder, such as anorexia or bulimia -bipolar disorder or psychosis -diabetes or high blood sugar, treated with medication -glaucoma -heart disease, previous heart attack, or irregular heart beat -head injury or brain tumor -high blood pressure -kidney or liver disease -seizures -suicidal thoughts or a previous suicide attempt -Tourette's syndrome -weight loss -an unusual or allergic reaction to bupropion, other medicines, foods, dyes, or preservatives -breast-feeding -pregnant or trying to become pregnant How should I use this medicine? Take this medicine by mouth with a glass of water. Follow the directions on the prescription label. You can take it with or without food. If it upsets your stomach, take it with food. Take your medicine at regular intervals. Do not take your medicine more often than directed. Do not stop taking this medicine suddenly except upon the advice of your doctor. Stopping this medicine too quickly may cause serious side effects or your condition may worsen. A special MedGuide will be given to you by the pharmacist with each prescription and refill. Be sure  to read this information carefully each time. Talk to your pediatrician regarding the use of this medicine in children. Special care may be needed. Overdosage: If you think you have taken too much of this medicine contact a poison control center or emergency room at once. NOTE: This medicine is only for you. Do not share this medicine with others. What if I miss a dose? If you miss a dose, take it as soon as you can. If it is less than four hours to your next dose, take only that dose and skip the missed dose. Do not take double or extra doses. What may interact with this medicine? Do not take this medicine with any of the following medications: -linezolid -MAOIs like Azilect, Carbex, Eldepryl, Marplan, Nardil, and Parnate -methylene blue (injected into a vein) -other medicines that contain bupropion like Zyban This medicine may also interact with the following medications: -alcohol -certain medicines for anxiety or sleep -certain medicines for blood pressure like metoprolol, propranolol -certain medicines for depression or psychotic disturbances -certain medicines for HIV or AIDS like efavirenz, lopinavir, nelfinavir, ritonavir -certain medicines for irregular heart beat like propafenone, flecainide -certain medicines for Parkinson's disease like amantadine, levodopa -certain medicines for seizures like carbamazepine, phenytoin, phenobarbital -cimetidine -clopidogrel -cyclophosphamide -digoxin -furazolidone -isoniazid -nicotine -orphenadrine -procarbazine -steroid medicines like prednisone or cortisone -stimulant medicines for attention disorders, weight loss, or to stay awake -tamoxifen -theophylline -thiotepa -ticlopidine -tramadol -warfarin This list may not describe all possible interactions. Give your health care provider a list of all the medicines, herbs, non-prescription drugs, or dietary supplements you use. Also  tell them if you smoke, drink alcohol, or use illegal  drugs. Some items may interact with your medicine. What should I watch for while using this medicine? Tell your doctor if your symptoms do not get better or if they get worse. Visit your doctor or health care professional for regular checks on your progress. Because it may take several weeks to see the full effects of this medicine, it is important to continue your treatment as prescribed by your doctor. Patients and their families should watch out for new or worsening thoughts of suicide or depression. Also watch out for sudden changes in feelings such as feeling anxious, agitated, panicky, irritable, hostile, aggressive, impulsive, severely restless, overly excited and hyperactive, or not being able to sleep. If this happens, especially at the beginning of treatment or after a change in dose, call your health care professional. Avoid alcoholic drinks while taking this medicine. Drinking excessive alcoholic beverages, using sleeping or anxiety medicines, or quickly stopping the use of these agents while taking this medicine may increase your risk for a seizure. Do not drive or use heavy machinery until you know how this medicine affects you. This medicine can impair your ability to perform these tasks. Do not take this medicine close to bedtime. It may prevent you from sleeping. Your mouth may get dry. Chewing sugarless gum or sucking hard candy, and drinking plenty of water may help. Contact your doctor if the problem does not go away or is severe. What side effects may I notice from receiving this medicine? Side effects that you should report to your doctor or health care professional as soon as possible: -allergic reactions like skin rash, itching or hives, swelling of the face, lips, or tongue -breathing problems -changes in vision -confusion -elevated mood, decreased need for sleep, racing thoughts, impulsive behavior -fast or irregular heartbeat -hallucinations, loss of contact with  reality -increased blood pressure -redness, blistering, peeling or loosening of the skin, including inside the mouth -seizures -suicidal thoughts or other mood changes -unusually weak or tired -vomiting Side effects that usually do not require medical attention (report to your doctor or health care professional if they continue or are bothersome): -constipation -headache -loss of appetite -nausea -tremors -weight loss This list may not describe all possible side effects. Call your doctor for medical advice about side effects. You may report side effects to FDA at 1-800-FDA-1088. Where should I keep my medicine? Keep out of the reach of children. Store at room temperature between 20 and 25 degrees C (68 and 77 degrees F), away from direct sunlight and moisture. Keep tightly closed. Throw away any unused medicine after the expiration date. NOTE: This sheet is a summary. It may not cover all possible information. If you have questions about this medicine, talk to your doctor, pharmacist, or health care provider.  2018 Elsevier/Gold Standard (2016-04-25 13:44:21)

## 2017-12-31 NOTE — Progress Notes (Signed)
Psychiatric Initial Adult Assessment   Patient Identification: Brandy Krause MRN:  [id number] Date of Evaluation:  12/31/2017 Referral Source: Otilio Miu MD Chief Complaint:  ' I cannot focus.'  Chief Complaint    Establish Care; Anxiety     Visit Diagnosis:    ICD-10-CM   1. GAD (generalized anxiety disorder) F41.1 busPIRone (BUSPAR) 5 MG tablet  2. Attention and concentration deficit R41.840 buPROPion (WELLBUTRIN) 75 MG tablet    History of Present Illness: [name] is a 32 year old African-American female, single, employed,lives in [place], has a history of anxiety, attention and concentration problems, history of multiple sclerosis, presented to the clinic today to establish care.  Lenox today reports that she has been struggling with anxiety symptoms since the past few months.  She reports racing heart rate, shortness of breath usually at work.  She also reports feeling nervous and on the edge, having trouble relaxing, being restless, hard to sit still, feeling afraid that something awful might happen and so on.  She reports all this has been getting worse since the past couple of months.  She reports she was started on a medication called Zoloft in November 2018.  She however took it for 2 days and stopped it because she felt it slowed her down.  [name] reports problems with focus and attention.  She reports she has always struggled with attention and focus problems even at school.  Reports it is a constant struggle for her at work.  She reports she has multiple unfinished task left at work which she has to complete.  She reports inability to comprehend things that others talk about.  She reports severe problem with her concentration.  She reports she is unable to sustain focus for a long time.  She reports sleep is good.  She denies any sadness.  She denies any appetite changes.  She denies any perceptual disturbances.  She denies any manic or hypomanic symptoms.  She denies  any history of trauma.  She denies abusing any drugs or alcohol.  She reports her boyfriend is very supportive.  She also has siblings who are supportive.  She reports she lost both her parents to cancer, mom in 2011 to colon cancer and dad in 2013 to lung cancer.  She herself has multiple sclerosis, diagnosed at the age of 41.  She reports she was on medications for a while but then she stopped.  She reports she has not had a follow-up with her neurologist/provider in a long time.  Associated Signs/Symptoms: Depression Symptoms:  fatigue, difficulty concentrating, anxiety, (Hypo) Manic Symptoms:  denies Anxiety Symptoms:  Excessive Worry, Psychotic Symptoms:  denies PTSD Symptoms: Negative  Past Psychiatric History: Reports she was started on medication by her primary medical doctor-Zoloft in November 2018 for her mood symptoms.  She did not stay compliant with it.  She reports it slowed her down.  She denies any suicidality.  She denies any inpatient mental health admissions.  Previous Psychotropic Medications: Yes , zoloft  Substance Abuse History in the last 12 months:  No.  Consequences of Substance Abuse: Negative  Past Medical History:  Past Medical History:  Diagnosis Date  . MS (multiple sclerosis) ([place])   . Multiple sclerosis ([place])     Past Surgical History:  Procedure Laterality Date  . WISDOM TOOTH EXTRACTION      Family Psychiatric History: She denies any history of mental health diagnosis in her family.  She reports her father was an alcoholic.  Family History:  Family History  Problem Relation Age of Onset  . Cancer Mother   . Cancer Father     Social History:   Social History   Socioeconomic History  . Marital status: Single    Spouse name: None  . Number of children: 0  . Years of education: None  . Highest education level: Bachelor's degree (e.g., BA, AB, BS)  Social Needs  . Financial resource strain: Not hard at all  . Food insecurity -  worry: Never true  . Food insecurity - inability: Never true  . Transportation needs - medical: No  . Transportation needs - non-medical: No  Occupational History  . Occupation: Shipper    Comment: full time  Tobacco Use  . Smoking status: Never Smoker  . Smokeless tobacco: Never Used  Substance and Sexual Activity  . Alcohol use: No  . Drug use: No  . Sexual activity: Yes    Birth control/protection: IUD, Condom  Other Topics Concern  . None  Social History Narrative  . None    Additional Social History: She is single.  She is employed at logistics.  She has a boyfriend with whom she has a good relationship, since the past 5 years.  She has a bachelor's in science degree.  Her parents passed away, mom due to colon cancer in 2011 and her dad due to lung cancer in 2013.  She has an extended family and they are all very supportive.  Allergies:  No Known Allergies  Metabolic Disorder Labs: No results found for: HGBA1C, MPG No results found for: PROLACTIN No results found for: CHOL, TRIG, HDL, CHOLHDL, VLDL, LDLCALC   Current Medications: Current Outpatient Medications  Medication Sig Dispense Refill  . levonorgestrel (MIRENA) 20 MCG/24HR IUD 1 each by Intrauterine route once.    Marland Kitchen buPROPion (WELLBUTRIN) 75 MG tablet Take 1 tablet (75 mg total) by mouth daily. 30 tablet 1  . busPIRone (BUSPAR) 5 MG tablet Take 1 tablet (5 mg total) by mouth 2 (two) times daily. 60 tablet 1   No current facility-administered medications for this visit.     Neurologic: Headache: No Seizure: No Paresthesias:No  Musculoskeletal: Strength & Muscle Tone: within normal limits Gait & Station: normal Patient leans: N/A  Psychiatric Specialty Exam: Review of Systems  Psychiatric/Behavioral: The patient is nervous/anxious.   All other systems reviewed and are negative.   Blood pressure 118/79, pulse 89, temperature 98.2 F (36.8 C), temperature source Oral, weight 187 lb 9.6 oz (85.1  kg).Body mass index is 31.22 kg/m.  General Appearance: Casual  Eye Contact:  Fair  Speech:  Clear and Coherent  Volume:  Normal  Mood:  Anxious  Affect:  Appropriate  Thought Process:  Goal Directed and Descriptions of Associations: Intact  Orientation:  Full (Time, Place, and Person)  Thought Content:  Logical  Suicidal Thoughts:  No  Homicidal Thoughts:  No  Memory:  Immediate;   Fair Recent;   Fair Remote;   Fair  Judgement:  Fair  Insight:  Fair  Psychomotor Activity:  Normal  Concentration:  Concentration: Fair and Attention Span: Fair  Recall:  [employer] of Knowledge:Fair  Language: Fair  Akathisia:  No  Handed:  Right  AIMS (if indicated):  na  Assets:  Communication Skills Desire for Improvement Financial Resources/Insurance Housing Intimacy Social Support Talents/Skills Transportation  ADL's:  Intact  Cognition: WNL  Sleep:  fair    Treatment Plan Summary:Jaide 32 year old African-American female who has a history of anxiety, attention  and concentration problems, multiple sclerosis, presented to the clinic today to establish care.  Patient reports worsening anxiety as well as attention problems since the past few months.  She currently denies any suicidality or substance abuse problems.  She has good social support and does not have any history of mental health problems in her family.  She does have a history of MS, currently noncompliant with her follow-ups.  Discussed plan as noted below. Medication management and Plan see below   Plan  GAD Start BuSpar 5 mg p.o. twice daily GAD 7 = 10  For attention and concentration deficit Start Wellbutrin 75 mg p.o. daily Referral for ADHD testing, provided Dr. Biagio Borg information.   Will get labs-TSH, provided lab slips.   Discussed with her to make an appointment with her neurologist to evaluate for her MS since she has not had a follow-up in a long time.  Follow up in clinic in 4 weeks or sooner if  needed.  More than 50 % of the time was spent for psychoeducation and supportive psychotherapy and care coordination.  This note was generated in part or whole with voice recognition software. Voice recognition is usually quite accurate but there are transcription errors that can and very often do occur. I apologize for any typographical errors that were not detected and corrected.      Ursula Alert, MD 2/14/20192:49 PM

## 2018-01-01 ENCOUNTER — Encounter: Payer: Self-pay | Admitting: Podiatry

## 2018-01-01 DIAGNOSIS — Z472 Encounter for removal of internal fixation device: Secondary | ICD-10-CM | POA: Diagnosis not present

## 2018-01-01 DIAGNOSIS — B079 Viral wart, unspecified: Secondary | ICD-10-CM | POA: Diagnosis not present

## 2018-01-01 DIAGNOSIS — M25571 Pain in right ankle and joints of right foot: Secondary | ICD-10-CM | POA: Diagnosis not present

## 2018-01-01 DIAGNOSIS — G43901 Migraine, unspecified, not intractable, with status migrainosus: Secondary | ICD-10-CM | POA: Diagnosis not present

## 2018-01-01 DIAGNOSIS — M21611 Bunion of right foot: Secondary | ICD-10-CM | POA: Diagnosis not present

## 2018-01-01 DIAGNOSIS — M2011 Hallux valgus (acquired), right foot: Secondary | ICD-10-CM | POA: Diagnosis not present

## 2018-01-01 DIAGNOSIS — D492 Neoplasm of unspecified behavior of bone, soft tissue, and skin: Secondary | ICD-10-CM | POA: Diagnosis not present

## 2018-01-01 DIAGNOSIS — B078 Other viral warts: Secondary | ICD-10-CM | POA: Diagnosis not present

## 2018-01-04 ENCOUNTER — Telehealth: Payer: Self-pay | Admitting: *Deleted

## 2018-01-04 DIAGNOSIS — M722 Plantar fascial fibromatosis: Secondary | ICD-10-CM

## 2018-01-04 NOTE — Telephone Encounter (Signed)
POST OP CALL-    1) General condition stated by the patient: Good   2) Is the pt having pain? "a little"  3) Pain score: 7  4) Has the pt taken Rx'd medication? Pain meds only at night, promethazine once, antibiotic daily  5) Is the pain medication giving relief? Yes  6) Any fever, chills, nausea, or vomiting? No  7) Any shortness of breath or tightness in the calf? No  8) Is the bandages clean, dry and intact? Yes  9) Is the bandage excessively tight? No  10) Is there excessive bleeding or drainage coming through the bandage? No  11) Did you understand all of the post op instruction sheet given? Patient asked if she could remove boot some while sitting, I advised she could, but anytime she gets up, the boot MUST be on.  12) Any questions or concerns regarding post op care/recovery? No    Confirmed POV appointment with patient

## 2018-01-06 ENCOUNTER — Ambulatory Visit (INDEPENDENT_AMBULATORY_CARE_PROVIDER_SITE_OTHER): Payer: 59

## 2018-01-06 ENCOUNTER — Encounter: Payer: Self-pay | Admitting: Podiatry

## 2018-01-06 ENCOUNTER — Ambulatory Visit (INDEPENDENT_AMBULATORY_CARE_PROVIDER_SITE_OTHER): Payer: 59 | Admitting: Podiatry

## 2018-01-06 VITALS — BP 125/93 | HR 86 | Temp 97.5°F | Resp 16

## 2018-01-06 DIAGNOSIS — M2011 Hallux valgus (acquired), right foot: Secondary | ICD-10-CM

## 2018-01-06 NOTE — Progress Notes (Signed)
She presents today 1 week status post [facility] bunion repair right foot.  Date of surgery January 01, 2018.  States that she is doing very well.  Objective: No erythema mild edema no cellulitis drainage or odor excision site of the lesion plantar aspect of the right foot is well there is no signs of infection.  We did not have to perform the second metatarsal osteotomy but the first metatarsal osteotomy incision site appears to be healing very well there is some mild edema and erythema in this area.  Assessment: Well-healing surgical foot.  Radiographs taken today demonstrate well placed osteotomy with internal fixation.  Plan: Redressed today dresser compressive dressing follow-up with her in 1 week

## 2018-01-07 ENCOUNTER — Encounter: Payer: Self-pay | Admitting: Podiatry

## 2018-01-13 ENCOUNTER — Encounter: Payer: Self-pay | Admitting: Podiatry

## 2018-01-13 ENCOUNTER — Ambulatory Visit (INDEPENDENT_AMBULATORY_CARE_PROVIDER_SITE_OTHER): Payer: 59 | Admitting: Podiatry

## 2018-01-13 DIAGNOSIS — M216X1 Other acquired deformities of right foot: Secondary | ICD-10-CM

## 2018-01-13 DIAGNOSIS — M2011 Hallux valgus (acquired), right foot: Secondary | ICD-10-CM

## 2018-01-13 NOTE — Progress Notes (Signed)
She presents today for follow-up of her Brandy Krause bunionectomy right foot and excision plantar lesion right foot.  She states that it feels okay still feels quite swollen.  She denies calf pain shortness of breath fever chills.  Objective: Vital signs are stable alert and oriented x3.  Pulses are palpable.  Neurologic sensorium is intact.  Deep tendon reflexes are intact she has good range of motion of the first metatarsophalangeal joint of the right foot margins appear to be well coapted and sutures were removed today.  I will leave the sutures in the plantar aspect for at least 1 more week she will follow-up with Brandy Krause in 1 week for suture removal and be allowed to wash after this.  Assessment: Well-healing surgical foot right.  Plan: Encouraged range of motion exercises.  Dispensed a Darco shoe today and redressed the foot.  She will follow-up with Brandy Krause in 1 week for suture removal to the plantar aspect of the foot as she will at that time be allowed to wash the foot provided the incision is healed.

## 2018-01-19 ENCOUNTER — Ambulatory Visit (INDEPENDENT_AMBULATORY_CARE_PROVIDER_SITE_OTHER): Payer: 59

## 2018-01-19 ENCOUNTER — Encounter: Payer: Self-pay | Admitting: Podiatry

## 2018-01-19 ENCOUNTER — Ambulatory Visit (INDEPENDENT_AMBULATORY_CARE_PROVIDER_SITE_OTHER): Payer: 59 | Admitting: Podiatry

## 2018-01-19 DIAGNOSIS — M2011 Hallux valgus (acquired), right foot: Secondary | ICD-10-CM

## 2018-01-19 DIAGNOSIS — Z9889 Other specified postprocedural states: Secondary | ICD-10-CM

## 2018-01-21 NOTE — Progress Notes (Signed)
   Subjective:  Patient presents today status post bunionectomy and callus excision of the right foot. DOS: 01/01/18. She states she is doing better. She reports the pain is tolerable and rates it at a 2/10 currently. Patient is here for further evaluation and treatment.    Past Medical History:  Diagnosis Date  . MS (multiple sclerosis) ([place])   . Multiple sclerosis ([place])       Objective/Physical Exam Neurovascular status intact.  Skin incisions appear to be well coapted with sutures and staples intact. No sign of infectious process noted. No dehiscence. No active bleeding noted. Moderate edema noted to the surgical extremity.  Radiographic Exam:  Orthopedic hardware and osteotomies sites appear to be stable with routine healing.  Assessment: 1. s/p bunionectomy and callus excision of the right foot. DOS: 01/01/18   Plan of Care:  1. Patient was evaluated. X-rays reviewed 2. Sutures removed. Dry sterile dressing applied.  3. Recommended OTC antibiotic ointment daily with a Band-Aid.  4. Continue weightbearing in post op shoe for 2 more weeks.  5. Return to clinic in 2 weeks with Dr. Milinda Pointer.    Edrick Kins, DPM [facility]  Dr. Edrick Kins, [place] [address]                                        [place], [place] [address]                Office [phone]  Fax [phone]

## 2018-01-22 NOTE — Progress Notes (Signed)
DOS 01/01/18 [place] bunionectomy Rt, Metatarsal osteotomy 2nd Rt,  Excision benign lesion 2.0 cm Rt

## 2018-01-28 ENCOUNTER — Ambulatory Visit: Payer: 59 | Admitting: Psychiatry

## 2018-02-10 ENCOUNTER — Ambulatory Visit: Payer: 59

## 2018-02-10 ENCOUNTER — Encounter: Payer: Self-pay | Admitting: Podiatry

## 2018-02-10 ENCOUNTER — Ambulatory Visit (INDEPENDENT_AMBULATORY_CARE_PROVIDER_SITE_OTHER): Payer: 59 | Admitting: Podiatry

## 2018-02-10 ENCOUNTER — Ambulatory Visit (INDEPENDENT_AMBULATORY_CARE_PROVIDER_SITE_OTHER): Payer: 59

## 2018-02-10 DIAGNOSIS — M216X1 Other acquired deformities of right foot: Secondary | ICD-10-CM

## 2018-02-10 DIAGNOSIS — Z9889 Other specified postprocedural states: Secondary | ICD-10-CM

## 2018-02-10 DIAGNOSIS — L603 Nail dystrophy: Secondary | ICD-10-CM

## 2018-02-10 DIAGNOSIS — M2011 Hallux valgus (acquired), right foot: Secondary | ICD-10-CM

## 2018-02-10 DIAGNOSIS — Q828 Other specified congenital malformations of skin: Secondary | ICD-10-CM

## 2018-02-10 NOTE — Progress Notes (Signed)
She presents today for postop visit date of surgery January 01, 2018 status post [facility] bunion repair and excision soft tissue lesion plantar aspect of the right foot.  She states this everything seems to be doing really well little bit tender on the plantar aspect however she is really concerned about the thickness of her toenails.  Objective: Vital signs are stable she is alert and oriented x3.  Pulses are palpable.  Great range of motion of the first metatarsophalangeal joint with minimal edema no erythema cellulitis drainage or odor.  Incision site is gone on to heal uneventfully.  She continues to wear her Darco shoe.  Toenails are thick yellow dystrophic-like mycotic sharply incurvated painful palpation as well as debridement particularly the hallux nail.  Radiographs taken today demonstrate well healing osteotomy with internal fixation in good position.  Assessment: Nail dystrophy.  Cannot rule out onychomycosis.  Well-healing surgical foot right.  Plan: Discussed etiology pathology conservative versus surgical therapies.  At this point samples of the toenails were taken today for pathologic evaluation.  I encouraged range of motion exercises for her toes.  Also encouraged her to get back into regular shoe gear but be very mindful of her activities.

## 2018-02-24 ENCOUNTER — Encounter: Payer: Self-pay | Admitting: Podiatry

## 2018-03-15 ENCOUNTER — Ambulatory Visit (INDEPENDENT_AMBULATORY_CARE_PROVIDER_SITE_OTHER): Payer: 59 | Admitting: Podiatry

## 2018-03-15 ENCOUNTER — Ambulatory Visit (INDEPENDENT_AMBULATORY_CARE_PROVIDER_SITE_OTHER): Payer: 59

## 2018-03-15 ENCOUNTER — Encounter: Payer: Self-pay | Admitting: Podiatry

## 2018-03-15 DIAGNOSIS — Z79899 Other long term (current) drug therapy: Secondary | ICD-10-CM

## 2018-03-15 DIAGNOSIS — M2011 Hallux valgus (acquired), right foot: Secondary | ICD-10-CM | POA: Diagnosis not present

## 2018-03-15 DIAGNOSIS — L603 Nail dystrophy: Secondary | ICD-10-CM

## 2018-03-15 MED ORDER — TERBINAFINE HCL 250 MG PO TABS: 250.0000 mg | ORAL_TABLET | Freq: Every day | ORAL | 0 refills | Status: DC

## 2018-03-15 NOTE — Patient Instructions (Signed)

## 2018-03-15 NOTE — Progress Notes (Signed)
She presents today for follow-up of her [place] bunionectomy right foot.  She presents ambulating in her Darco shoe which I had recommended she get back into her regular shoe.  She states that is still a bit tender.  She is also here today for follow-up of her pathology results.  Objective: Vital signs are stable alert and oriented x3.  Pulses are palpable.  Great range of motion of the first metatarsophalangeal joint of the right foot status post [place] bunion repair data surgery January 01, 2018.  Mild tenderness on palpation of plantar sesamoids.  Toenails are thick yellow dystrophic and clinically mycotic radiographs taken today demonstrate a well-healing osteotomy internal fixation is in good position and intact.  Assessment: Painful limp secondary to onychomycosis.  Resolving inflammation with well-healing first metatarsal osteotomy right foot.  Plan: I am going to recommend that she go back to work in her regular scheduled return to work date May 14 provided she can wear shoes.  She has not been wearing regular shoes as of yet.  We got a push hard to try to get her ready for the 14th.  I also started her today on Lamisil 30 tablets 1 p.o. daily we did discuss the possible side effects and complications with Lamisil.  We are also requesting a liver profile which was provided to her today in the form of a requisition.  Follow-up with me in 1 month.  No further x-rays are needed for surgery.

## 2018-03-17 DIAGNOSIS — Z79899 Other long term (current) drug therapy: Secondary | ICD-10-CM | POA: Diagnosis not present

## 2018-03-18 ENCOUNTER — Telehealth: Payer: Self-pay | Admitting: *Deleted

## 2018-03-18 LAB — HEPATIC FUNCTION PANEL
ALT: 16 IU/L (ref 0–32)
AST: 18 IU/L (ref 0–40)
Albumin: 4.3 g/dL (ref 3.5–5.5)
Alkaline Phosphatase: 52 IU/L (ref 39–117)
Bilirubin Total: 0.6 mg/dL (ref 0.0–1.2)
Bilirubin, Direct: 0.14 mg/dL (ref 0.00–0.40)
Total Protein: 7.2 g/dL (ref 6.0–8.5)

## 2018-03-18 NOTE — Telephone Encounter (Signed)
I informed pt of Dr. Hyatt's review of results and orders. 

## 2018-03-18 NOTE — Telephone Encounter (Signed)
-----   Message from Garrel Ridgel, [place] sent at 03/18/2018  6:45 AM EDT ----- Blood work looks good and may continue medication.

## 2018-03-25 DIAGNOSIS — M722 Plantar fascial fibromatosis: Secondary | ICD-10-CM

## 2018-04-06 ENCOUNTER — Encounter: Payer: Self-pay | Admitting: Emergency Medicine

## 2018-04-06 ENCOUNTER — Ambulatory Visit
Admission: EM | Admit: 2018-04-06 | Discharge: 2018-04-06 | Disposition: A | Discharge status: Home / Self Care | Payer: 59 | Attending: Family Medicine | Admitting: Family Medicine

## 2018-04-06 ENCOUNTER — Other Ambulatory Visit: Payer: Self-pay

## 2018-04-06 DIAGNOSIS — R519 Headache, unspecified: Secondary | ICD-10-CM

## 2018-04-06 DIAGNOSIS — R51 Headache: Secondary | ICD-10-CM

## 2018-04-06 DIAGNOSIS — R42 Dizziness and giddiness: Secondary | ICD-10-CM

## 2018-04-06 DIAGNOSIS — G35 Multiple sclerosis: Secondary | ICD-10-CM | POA: Diagnosis not present

## 2018-04-06 MED ORDER — NAPROXEN 500 MG PO TABS
500.0000 mg | ORAL_TABLET | Freq: Two times a day (BID) | ORAL | 0 refills | Status: DC
Start: 2018-04-06 — End: 2018-06-29

## 2018-04-06 MED ORDER — MECLIZINE HCL 25 MG PO TABS: 25.0000 mg | ORAL_TABLET | Freq: Three times a day (TID) | ORAL | 0 refills | Status: DC | PRN

## 2018-04-06 NOTE — ED Triage Notes (Signed)
Patient c/o HAs and light headedness for the past 3 weeks.

## 2018-04-06 NOTE — ED Provider Notes (Signed)
[facility]    CSN: [id number] Arrival date & time: 04/06/18  0809     History   Chief Complaint Chief Complaint  Patient presents with  . Headache    HPI Brandy Krause is a 32 y.o. female.   HPI  32 year old female presents with headaches variable in location affecting her on a daily basis and lightheadedness that she has had for the past 3 weeks.  Denies any visual disturbances denies any nausea or vomiting denies neurological symptoms such as numbness or tingling loss of function or falling.  Has a history of multiple sclerosis relapsing remitting type; she has not been taking her medicines for over 10 years and has not been under anyone's care for this.  States that she has these symptoms on a daily basis. Some  Days will vary in the degree of severity.  Has had no fever or chills.                Past Medical History:  Diagnosis Date  . MS (multiple sclerosis) ([place])   . Multiple sclerosis [facility])     Patient Active Problem List   Diagnosis Date Noted  . Change in bowel habits 09/19/2014  . FH: colon cancer 09/19/2014  . Occult blood in stools 09/19/2014  . Constipation 08/03/2014  . Multiple sclerosis, relapsing-remitting ([place]) 08/03/2014    Past Surgical History:  Procedure Laterality Date  . WISDOM TOOTH EXTRACTION      OB History   None      Home Medications    Prior to Admission medications   Medication Sig Start Date End Date Taking? Authorizing Provider  terbinafine (LAMISIL) 250 MG tablet Take 1 tablet (250 mg total) by mouth daily. 03/15/18  Yes Hyatt, Max T, DPM  levonorgestrel (MIRENA) 20 MCG/24HR IUD 1 each by Intrauterine route once.    [provider]  meclizine (ANTIVERT) 25 MG tablet Take 1 tablet (25 mg total) by mouth 3 (three) times daily as needed for dizziness. 04/06/18   Lorin Picket, PA-C  naproxen (NAPROSYN) 500 MG tablet Take 1 tablet (500 mg total) by mouth 2 (two) times daily. Use for headache.  04/06/18   Lorin Picket, PA-C    Family History Family History  Problem Relation Age of Onset  . Cancer Mother   . Cancer Father     Social History Social History   Tobacco Use  . Smoking status: Never Smoker  . Smokeless tobacco: Never Used  Substance Use Topics  . Alcohol use: No  . Drug use: No     Allergies   Patient has no known allergies.   Review of Systems Review of Systems  Constitutional: Positive for activity change. Negative for appetite change, chills, fatigue and fever.  Neurological: Positive for light-headedness and headaches. Negative for tremors, seizures, syncope, facial asymmetry, speech difficulty, weakness and numbness.  All other systems reviewed and are negative.    Physical Exam Triage Vital Signs ED Triage Vitals  Enc Vitals Group     BP 04/06/18 0837 112/87     Pulse Rate 04/06/18 0837 86     Resp 04/06/18 0837 14     Temp 04/06/18 0837 98.3 F (36.8 C)     Temp Source 04/06/18 0837 Oral     SpO2 04/06/18 0837 100 %     Weight 04/06/18 0835 170 lb (77.1 kg)     Height 04/06/18 0835 5\' 5"  (1.651 m)     Head Circumference --  Peak Flow --      Pain Score 04/06/18 0835 0     Pain Loc --      Pain Edu? --      Excl. in [place]? --    No data found.  Updated Vital Signs BP 112/87 (BP Location: Left Arm)   Pulse 86   Temp 98.3 F (36.8 C) (Oral)   Resp 14   Ht 5\' 5"  (1.651 m)   Wt 170 lb (77.1 kg)   SpO2 100%   BMI 28.29 kg/m   Visual Acuity Right Eye Distance:   Left Eye Distance:   Bilateral Distance:    Right Eye Near:   Left Eye Near:    Bilateral Near:     Physical Exam  Constitutional: She is oriented to person, place, and time. She appears well-developed and well-nourished.  Non-toxic appearance. She does not appear ill. No distress.  HENT:  Head: Normocephalic and atraumatic.  Eyes: Pupils are equal, round, and reactive to light. EOM are normal. Right eye exhibits no nystagmus.  3-4 beat lateral gaze  nystagmus to the left.  Neck: Normal range of motion. Neck supple.  Pulmonary/Chest: Effort normal and breath sounds normal.  Musculoskeletal: Normal range of motion.  Neurological: She is alert and oriented to person, place, and time. She has normal strength. She displays normal reflexes. No cranial nerve deficit or sensory deficit. She displays a negative Romberg sign. Coordination and gait normal. GCS eye subscore is 4. GCS verbal subscore is 5. GCS motor subscore is 6.  Skin: Skin is warm and dry.  Psychiatric: She has a normal mood and affect. Her behavior is normal.  Nursing note and vitals reviewed.    UC Treatments / Results  Labs (all labs ordered are listed, but only abnormal results are displayed) Labs Reviewed - No data to display  EKG None  Radiology No results found.  Procedures Procedures (including critical care time)  Medications Ordered in UC Medications - No data to display  Initial Impression / Assessment and Plan / UC Course  I have reviewed the triage vital signs and the nursing notes.  Pertinent labs & imaging results that were available during my care of the patient were reviewed by me and considered in my medical decision making (see chart for details).     Plan: 1. Test/x-ray results and diagnosis reviewed with patient 2. rx as per orders; risks, benefits, potential side effects reviewed with patient 3. Recommend supportive treatment with arranging appointment with the primary care physician Dr. Ronnald Ramp as soon as possible.  In the meantime I will start her on Antivert for dizziness and Naprosyn for headaches.  If she worsens she should be seen in the emergency room or with an ENT. 4. F/u prn if symptoms worsen or don't improve  Final Clinical Impressions(s) / UC Diagnoses   Final diagnoses:  Acute nonintractable headache, unspecified headache type  Episode of dizziness  Multiple sclerosis [facility])   Discharge Instructions   None    ED  Prescriptions    Medication Sig Dispense Auth. Provider   meclizine (ANTIVERT) 25 MG tablet Take 1 tablet (25 mg total) by mouth 3 (three) times daily as needed for dizziness. 30 tablet Crecencio Mc P, PA-C   naproxen (NAPROSYN) 500 MG tablet Take 1 tablet (500 mg total) by mouth 2 (two) times daily. Use for headache. 30 tablet Lorin Picket, PA-C     Controlled Substance Prescriptions [place] Controlled Substance Registry consulted? Not Applicable  Lorin Picket, PA-C 04/06/18 1142

## 2018-04-07 ENCOUNTER — Encounter: Payer: Self-pay | Admitting: Family Medicine

## 2018-04-07 ENCOUNTER — Ambulatory Visit (INDEPENDENT_AMBULATORY_CARE_PROVIDER_SITE_OTHER): Payer: 59 | Admitting: Family Medicine

## 2018-04-07 VITALS — BP 110/70 | HR 80 | Ht 65.0 in | Wt 172.0 lb

## 2018-04-07 DIAGNOSIS — H8309 Labyrinthitis, unspecified ear: Secondary | ICD-10-CM

## 2018-04-07 DIAGNOSIS — G35 Multiple sclerosis: Secondary | ICD-10-CM | POA: Diagnosis not present

## 2018-04-07 NOTE — Progress Notes (Signed)
Name: Brandy Krause   MRN: [id number]    DOB: 01-Aug-1986   Date:04/08/2018       Progress Note  Subjective  Chief Complaint  Chief Complaint  Patient presents with  . Dizziness    urgent care Dr told her to come to PCP to "get back in with a doctor for MS". Hasn't been to [facility] approx 7 years    Dizziness  This is a new ("swimmy headache) problem. The current episode started 1 to 4 weeks ago (3 weeks). The problem occurs constantly. The problem has been gradually improving. Associated symptoms include fatigue and vertigo. Pertinent negatives include no abdominal pain, anorexia, arthralgias, change in bowel habit, chest pain, chills, congestion, coughing, diaphoresis, fever, headaches, joint swelling, myalgias, nausea, neck pain, numbness, rash, sore throat, swollen glands, urinary symptoms, visual change, vomiting or weakness. The symptoms are aggravated by twisting (turning head). Treatments tried: has meclizine /has not started. Improvement on treatment: stay still.    No problem-specific Assessment & Plan notes found for this encounter.   Past Medical History:  Diagnosis Date  . MS (multiple sclerosis) ([place])   . Multiple sclerosis ([place])     Past Surgical History:  Procedure Laterality Date  . WISDOM TOOTH EXTRACTION      Family History  Problem Relation Age of Onset  . Cancer Mother   . Cancer Father     Social History   Socioeconomic History  . Marital status: Single    Spouse name: Not on file  . Number of children: 0  . Years of education: Not on file  . Highest education level: Bachelor's degree (e.g., BA, AB, BS)  Occupational History  . Occupation: Shipper    Comment: full time  Social Needs  . Financial resource strain: Not hard at all  . Food insecurity:    Worry: Never true    Inability: Never true  . Transportation needs:    Medical: No    Non-medical: No  Tobacco Use  . Smoking status: Never Smoker  . Smokeless tobacco: Never Used  Substance and  Sexual Activity  . Alcohol use: No  . Drug use: No  . Sexual activity: Yes    Birth control/protection: IUD, Condom  Lifestyle  . Physical activity:    Days per week: 0 days    Minutes per session: 0 min  . Stress: Only a little  Relationships  . Social connections:    Talks on phone: Once a week    Gets together: Once a week    Attends religious service: Never    Active member of club or organization: No    Attends meetings of clubs or organizations: Never    Relationship status: Never married  . Intimate partner violence:    Fear of current or ex partner: No    Emotionally abused: No    Physically abused: No    Forced sexual activity: No  Other Topics Concern  . Not on file  Social History Narrative  . Not on file    No Known Allergies  Outpatient Medications Prior to Visit  Medication Sig Dispense Refill  . levonorgestrel (MIRENA) 20 MCG/24HR IUD 1 each by Intrauterine route once.    . meclizine (ANTIVERT) 25 MG tablet Take 1 tablet (25 mg total) by mouth 3 (three) times daily as needed for dizziness. 30 tablet 0  . naproxen (NAPROSYN) 500 MG tablet Take 1 tablet (500 mg total) by mouth 2 (two) times daily. Use for headache. [place]  tablet 0  . terbinafine (LAMISIL) 250 MG tablet Take 1 tablet (250 mg total) by mouth daily. 30 tablet 0   No facility-administered medications prior to visit.     Review of Systems  Constitutional: Positive for fatigue. Negative for chills, diaphoresis, fever, malaise/fatigue and weight loss.  HENT: Negative for congestion, ear discharge, ear pain, hearing loss, nosebleeds, sore throat and tinnitus.   Eyes: Negative for blurred vision.  Respiratory: Negative for cough, sputum production, shortness of breath and wheezing.   Cardiovascular: Negative for chest pain, palpitations and leg swelling.  Gastrointestinal: Negative for abdominal pain, anorexia, blood in stool, change in bowel habit, constipation, diarrhea, heartburn, melena, nausea and  vomiting.  Genitourinary: Negative for dysuria, frequency, hematuria and urgency.  Musculoskeletal: Negative for arthralgias, back pain, joint pain, joint swelling, myalgias and neck pain.  Skin: Negative for rash.  Neurological: Positive for dizziness and vertigo. Negative for tingling, sensory change, focal weakness, weakness, numbness and headaches.       No headache today  Endo/Heme/Allergies: Negative for environmental allergies and polydipsia. Does not bruise/bleed easily.  Psychiatric/Behavioral: Negative for depression and suicidal ideas. The patient is not nervous/anxious and does not have insomnia.      Objective  Vitals:   04/07/18 0946  BP: 110/70  Pulse: 80  Weight: 172 lb (78 kg)  Height: 5\' 5"  (1.651 m)    Physical Exam  Constitutional: No distress.  HENT:  Head: Normocephalic and atraumatic.  Right Ear: External ear normal.  Left Ear: External ear normal.  Nose: Nose normal.  Mouth/Throat: Oropharynx is clear and moist.  Eyes: Pupils are equal, round, and reactive to light. Conjunctivae and EOM are normal. Right eye exhibits no discharge. Left eye exhibits no discharge.  Neck: Normal range of motion. Neck supple. No JVD present. No thyromegaly present.  Cardiovascular: Normal rate, regular rhythm, normal heart sounds and intact distal pulses. Exam reveals no gallop and no friction rub.  No murmur heard. Pulmonary/Chest: Effort normal and breath sounds normal.  Abdominal: Soft. Bowel sounds are normal. She exhibits no mass. There is no tenderness. There is no guarding.  Musculoskeletal: Normal range of motion. She exhibits no edema.  Lymphadenopathy:    She has no cervical adenopathy.  Neurological: She is alert. She has normal strength and normal reflexes. A cranial nerve deficit is present. No sensory deficit.  Reflex Scores:      Tricep reflexes are 2+ on the right side and 2+ on the left side.      Bicep reflexes are 2+ on the right side and 2+ on the left  side.      Brachioradialis reflexes are 2+ on the right side and 2+ on the left side.      Patellar reflexes are 2+ on the right side and 2+ on the left side.      Achilles reflexes are 2+ on the right side and 2+ on the left side. Skin: Skin is warm and dry. She is not diaphoretic.  Nursing note and vitals reviewed.     Assessment & Plan  Problem List Items Addressed This Visit    None    Visit Diagnoses    Labyrinthitis, unspecified laterality    -  Primary   instructed to initiate meclizine/and adequetly hydrate   Multiple sclerosis ([place])       Patient needs to resume neurology contact for evaluation and possible treatment   Relevant Orders   Ambulatory referral to Neurology      No  orders of the defined types were placed in this encounter.     Dr. [facility]  04/08/18

## 2018-04-14 ENCOUNTER — Ambulatory Visit: Payer: 59

## 2018-04-14 ENCOUNTER — Ambulatory Visit (INDEPENDENT_AMBULATORY_CARE_PROVIDER_SITE_OTHER): Payer: 59 | Admitting: Podiatry

## 2018-04-14 ENCOUNTER — Encounter: Payer: Self-pay | Admitting: Podiatry

## 2018-04-14 DIAGNOSIS — M2011 Hallux valgus (acquired), right foot: Secondary | ICD-10-CM

## 2018-04-14 DIAGNOSIS — M216X1 Other acquired deformities of right foot: Secondary | ICD-10-CM

## 2018-04-14 DIAGNOSIS — Z79899 Other long term (current) drug therapy: Secondary | ICD-10-CM

## 2018-04-14 DIAGNOSIS — L603 Nail dystrophy: Secondary | ICD-10-CM | POA: Diagnosis not present

## 2018-04-14 MED ORDER — TERBINAFINE HCL 250 MG PO TABS: 250.0000 mg | ORAL_TABLET | Freq: Every day | ORAL | 0 refills | Status: DC

## 2018-04-14 NOTE — Progress Notes (Signed)
She presents today for follow-up of her use of Lamisil.  She denies fever chills nausea vomiting muscle aches pains itching or rashes.  States that she has had no problems with the medication.  Objective: No change in the nails as of yet.  Pulses remain palpable.  Assessment: Onychomycosis being treated with Lamisil.  Plan: Follow-up with me in 4 months.  Prescription for 90 more tablets 1 daily.  Also requisition for blood work.  Should this come back abnormal we will notify her immediately.

## 2018-04-15 DIAGNOSIS — R200 Anesthesia of skin: Secondary | ICD-10-CM | POA: Diagnosis not present

## 2018-04-15 DIAGNOSIS — R42 Dizziness and giddiness: Secondary | ICD-10-CM | POA: Diagnosis not present

## 2018-04-15 DIAGNOSIS — G35 Multiple sclerosis: Secondary | ICD-10-CM | POA: Diagnosis not present

## 2018-04-16 ENCOUNTER — Telehealth: Payer: Self-pay | Admitting: *Deleted

## 2018-04-16 ENCOUNTER — Telehealth: Payer: Self-pay | Admitting: Podiatry

## 2018-04-16 NOTE — Telephone Encounter (Signed)
Patient is scheduled for a MRI tomorrow and just recently had surgery back on 01/01/18 which she has a screw in her foot. Does she need to reschedule that apt? Please give the patient a call back today at [phone]

## 2018-04-16 NOTE — Telephone Encounter (Signed)
Pt states she was scheduled for MRI by [facility] and has metal in her foot from 12/2017 surgery by Dr. Milinda Pointer. I told pt she would benefit from asking the facility that was to perform the MRI if it would be okay. Pt states she will call.

## 2018-04-17 DIAGNOSIS — R9082 White matter disease, unspecified: Secondary | ICD-10-CM | POA: Diagnosis not present

## 2018-04-17 DIAGNOSIS — G35 Multiple sclerosis: Secondary | ICD-10-CM | POA: Diagnosis not present

## 2018-04-18 NOTE — Telephone Encounter (Signed)
She should be okay most of the screws nowadays are not reactive with MRI.  But she needs to inform the MRI tech that she does have a screw in her foot

## 2018-04-20 DIAGNOSIS — R200 Anesthesia of skin: Secondary | ICD-10-CM | POA: Diagnosis not present

## 2018-04-20 DIAGNOSIS — G35 Multiple sclerosis: Secondary | ICD-10-CM | POA: Diagnosis not present

## 2018-04-20 DIAGNOSIS — Z79899 Other long term (current) drug therapy: Secondary | ICD-10-CM | POA: Diagnosis not present

## 2018-04-20 DIAGNOSIS — R42 Dizziness and giddiness: Secondary | ICD-10-CM | POA: Diagnosis not present

## 2018-04-21 LAB — HEPATIC FUNCTION PANEL
ALBUMIN: 4.5 g/dL (ref 3.5–5.5)
ALT: 13 IU/L (ref 0–32)
AST: 15 IU/L (ref 0–40)
Alkaline Phosphatase: 49 IU/L (ref 39–117)
BILIRUBIN TOTAL: 0.3 mg/dL (ref 0.0–1.2)
Bilirubin, Direct: 0.09 mg/dL (ref 0.00–0.40)
Total Protein: 7.5 g/dL (ref 6.0–8.5)

## 2018-04-22 ENCOUNTER — Telehealth: Payer: Self-pay | Admitting: *Deleted

## 2018-04-22 NOTE — Telephone Encounter (Signed)
-----   Message from Garrel Ridgel, [place] sent at 04/21/2018  6:53 AM EDT ----- Blood work looks perfect may continue medication.

## 2018-04-22 NOTE — Telephone Encounter (Signed)
Unable to leave a message on [phone], mail box is full. Unable to leave a message on [phone] phone was busy.

## 2018-04-22 NOTE — Telephone Encounter (Signed)
I got a call from this number about 20 minutes ago. The best contact number for me is [phone]. Thank you.

## 2018-04-26 NOTE — Telephone Encounter (Signed)
Unable to leave a message on pt's [phone] home phone, voicemail box is not set up.

## 2018-05-05 ENCOUNTER — Other Ambulatory Visit: Payer: Self-pay | Admitting: Neurology

## 2018-05-05 ENCOUNTER — Telehealth: Payer: Self-pay | Admitting: *Deleted

## 2018-05-05 ENCOUNTER — Encounter: Payer: Self-pay | Admitting: *Deleted

## 2018-05-05 DIAGNOSIS — G35 Multiple sclerosis: Secondary | ICD-10-CM

## 2018-05-05 NOTE — Telephone Encounter (Signed)
Unable to leave a message on pt's voicemail, sent letter informing pt of results and orders.

## 2018-05-12 DIAGNOSIS — R42 Dizziness and giddiness: Secondary | ICD-10-CM | POA: Diagnosis not present

## 2018-05-12 DIAGNOSIS — R51 Headache: Secondary | ICD-10-CM | POA: Diagnosis not present

## 2018-05-12 DIAGNOSIS — G35 Multiple sclerosis: Secondary | ICD-10-CM | POA: Diagnosis not present

## 2018-05-19 ENCOUNTER — Ambulatory Visit
Admission: RE | Admit: 2018-05-19 | Discharge: 2018-05-19 | Disposition: A | Discharge status: Home / Self Care | Payer: 59 | Source: Ambulatory Visit | Attending: Neurology | Admitting: Neurology

## 2018-05-19 DIAGNOSIS — G35 Multiple sclerosis: Secondary | ICD-10-CM | POA: Insufficient documentation

## 2018-05-19 DIAGNOSIS — R51 Headache: Secondary | ICD-10-CM | POA: Diagnosis not present

## 2018-05-19 LAB — ALBUMIN: Albumin: 4.4 g/dL (ref 3.5–5.0)

## 2018-05-19 LAB — CSF CELL COUNT WITH DIFFERENTIAL
EOS CSF: 0 %
LYMPHS CSF: 94 %
MONOCYTE-MACROPHAGE-SPINAL FLUID: 6 %
OTHER CELLS CSF: 0
RBC COUNT CSF: 6 /mm3 — AB (ref 0–3)
Segmented Neutrophils-CSF: 0 %
TUBE #: 3
WBC, CSF: 2 /mm3 (ref 0–5)

## 2018-05-19 LAB — PROTEIN, CSF: TOTAL PROTEIN, CSF: 18 mg/dL (ref 15–45)

## 2018-05-19 LAB — GLUCOSE, CSF: Glucose, CSF: 55 mg/dL (ref 40–70)

## 2018-05-19 MED ORDER — ACETAMINOPHEN 325 MG PO TABS
650.0000 mg | ORAL_TABLET | ORAL | Status: DC | PRN
  Filled 2018-05-19: qty 2

## 2018-05-19 NOTE — Discharge Instructions (Signed)
Lumbar Puncture, Care After °Refer to this sheet in the next few weeks. These instructions provide you with information on caring for yourself after your procedure. Your health care provider may also give you more specific instructions. Your treatment has been planned according to current medical practices, but problems sometimes occur. Call your health care provider if you have any problems or questions after your procedure. °What can I expect after the procedure? °After your procedure, it is typical to have the following sensations: °· Mild discomfort or pain at the insertion site. °· Mild headache that is relieved with pain medicines. ° °Follow these instructions at home: ° °· Avoid lifting anything heavier than 10 lb (4.5 kg) for at least 12 hours after the procedure. °· Drink enough fluids to keep your urine clear or pale yellow. °Contact a health care provider if: °· You have fever or chills. °· You have nausea or vomiting. °· You have a headache that lasts for more than 2 days. °Get help right away if: °· You have any numbness or tingling in your legs. °· You are unable to control your bowel or bladder. °· You have bleeding or swelling in your back at the insertion site. °· You are dizzy or faint. °This information is not intended to replace advice given to you by your health care provider. Make sure you discuss any questions you have with your health care provider. °Document Released: 11/08/2013 Document Revised: 04/10/2016 Document Reviewed: 07/12/2013 °Elsevier Interactive Patient Education © 2017 Else[employer] Inc[employer] ° °

## 2018-05-19 NOTE — Progress Notes (Signed)
Patient denies complaints at this time post lumbar puncture. DR [place] out to speak with patient earlier post procedure with questions answered. Discharge instructions given with questions answered.

## 2018-05-20 LAB — IGG CSF INDEX
Albumin CSF-mCnc: 11 mg/dL (ref 11–48)
Albumin: 4.5 g/dL (ref 3.5–5.5)
CSF IgG Index: 0.7 (ref 0.0–0.7)
IgG (Immunoglobin G), Serum: 1821 mg/dL — ABNORMAL HIGH (ref 700–1600)
IgG, CSF: 3.2 mg/dL (ref 0.0–8.6)
IgG/Alb Ratio, CSF: 0.29 — ABNORMAL HIGH (ref 0.00–0.25)

## 2018-05-22 LAB — MISC LABCORP TEST (SEND OUT): Labcorp test code: 139370

## 2018-05-22 LAB — CSF CULTURE W GRAM STAIN: Culture: NO GROWTH

## 2018-05-22 LAB — CSF CULTURE: GRAM STAIN: NONE SEEN

## 2018-05-26 LAB — OLIGOCLONAL BANDS, CSF + SERM

## 2018-05-29 LAB — HSV(HERPES SMPLX VRS)ABS-I+II(IGG)-CSF

## 2018-06-29 ENCOUNTER — Encounter: Payer: Self-pay | Admitting: Maternal Newborn

## 2018-06-29 ENCOUNTER — Other Ambulatory Visit (HOSPITAL_COMMUNITY)
Admission: RE | Admit: 2018-06-29 | Discharge: 2018-06-29 | Disposition: A | Discharge status: Home / Self Care | Payer: 59 | Source: Ambulatory Visit | Attending: Maternal Newborn | Admitting: Maternal Newborn

## 2018-06-29 ENCOUNTER — Ambulatory Visit (INDEPENDENT_AMBULATORY_CARE_PROVIDER_SITE_OTHER): Payer: 59 | Admitting: Maternal Newborn

## 2018-06-29 VITALS — BP 110/80 | HR 97 | Ht 65.0 in | Wt 176.5 lb

## 2018-06-29 DIAGNOSIS — Z30013 Encounter for initial prescription of injectable contraceptive: Secondary | ICD-10-CM | POA: Diagnosis not present

## 2018-06-29 DIAGNOSIS — Z01419 Encounter for gynecological examination (general) (routine) without abnormal findings: Secondary | ICD-10-CM | POA: Diagnosis not present

## 2018-06-29 DIAGNOSIS — Z30432 Encounter for removal of intrauterine contraceptive device: Secondary | ICD-10-CM

## 2018-06-29 DIAGNOSIS — Z124 Encounter for screening for malignant neoplasm of cervix: Secondary | ICD-10-CM | POA: Diagnosis not present

## 2018-06-29 MED ORDER — MEDROXYPROGESTERONE ACETATE 150 MG/ML IM SUSP: 150.0000 mg | INTRAMUSCULAR | 3 refills | Status: DC

## 2018-06-29 NOTE — Progress Notes (Signed)
  GYNECOLOGY OFFICE PROCEDURE NOTE  Brandy Krause is a 32 y.o. G0P0000 here for Mirena IUD removal. She desires removal secondary to starting another form of contraception.  IUD Removal  Patient identified, informed consent performed, consent signed.  Patient was in the dorsal lithotomy position, normal external genitalia was noted.  A speculum was placed in the patient's vagina, normal discharge was noted, no lesions. The cervix was visualized, no lesions, no abnormal discharge.  The strings of the IUD were grasped and pulled using ring forceps. The IUD was removed in its entirety.  Patient tolerated the procedure well.    Patient will use DepoProvera for contraception. Routine preventative health maintenance measures emphasized.   Avel Sensor, CNM

## 2018-06-29 NOTE — Progress Notes (Signed)
Gynecology Annual Exam  PCP: Juline Patch, MD  Chief Complaint:  Chief Complaint  Patient presents with  . Gynecologic Exam    remove IUD; thinks she'd like to have depo    History of Present Illness: Patient is a 32 y.o. G0P0000 presents for annual exam. The patient has no complaints today.   LMP: No LMP recorded (lmp unknown). (Menstrual status: IUD). Menses absent with IUD.  The patient is sexually active. She currently uses IUD for contraception. She denies dyspareunia.  The patient does perform self breast exams.  There is notable family history of breast cancer in her family; she had genetic screening in 2017.  The patient wears seatbelts: yes.   The patient has regular exercise: no.    The patient denies current symptoms of depression.    Review of Systems  Constitutional: Negative.   HENT: Negative.   Eyes: Negative.   Respiratory: Negative for cough, shortness of breath and wheezing.   Cardiovascular: Negative for chest pain and palpitations.  Gastrointestinal: Negative for abdominal pain, constipation, diarrhea and nausea.  Genitourinary: Negative.   Musculoskeletal: Negative.   Skin: Negative.   Neurological: Positive for headaches.  Endo/Heme/Allergies:       Heat/cold intolerance  Psychiatric/Behavioral: Negative.   All other systems reviewed and are negative.   Past Medical History:  Past Medical History:  Diagnosis Date  . At high risk for breast cancer 05/2016   IBIS = 20.6% PER mYRIAD; RECALCULATE AGE 32  . Family history of colon cancer 06/30/2016   mom dx'd at 54; rec scr colonoscopies starting at age 72  . Genetic testing of female 05/2016   NEGATIVE  . History of Papanicolaou smear of cervix 10/06/14; 05/06/16   NEG, CT/GC NEG; NEG  . MS (multiple sclerosis) ([place])   . Multiple sclerosis ([place])     Past Surgical History:  Past Surgical History:  Procedure Laterality Date  . WISDOM TOOTH EXTRACTION  2005    Gynecologic History:  No  LMP recorded (lmp unknown). (Menstrual status: IUD). Contraception: Removing IUD today, plans Depo Last Pap: 05/06/2016.  Results were: NILM  Obstetric History: G0P0000  Family History:  Family History  Problem Relation Age of Onset  . Cancer Mother 92       COLON  . Other Mother        cervical dysplasia  . Cancer Father 22       pancreatic, lung  . Lung disease Father   . Breast cancer Paternal Aunt 48    Social History:  Social History   Socioeconomic History  . Marital status: Single    Spouse name: Not on file  . Number of children: 0  . Years of education: 16  . Highest education level: Bachelor's degree (e.g., BA, AB, BS)  Occupational History  . Occupation: Shipper    Comment: full time  Social Needs  . Financial resource strain: Not hard at all  . Food insecurity:    Worry: Never true    Inability: Never true  . Transportation needs:    Medical: No    Non-medical: No  Tobacco Use  . Smoking status: Never Smoker  . Smokeless tobacco: Never Used  Substance and Sexual Activity  . Alcohol use: Yes    Comment: LIGHT  . Drug use: No  . Sexual activity: Yes    Birth control/protection: IUD, Condom  Lifestyle  . Physical activity:    Days per week: 0 days    Minutes  per session: 0 min  . Stress: Only a little  Relationships  . Social connections:    Talks on phone: Once a week    Gets together: Once a week    Attends religious service: Never    Active member of club or organization: No    Attends meetings of clubs or organizations: Never    Relationship status: Never married  . Intimate partner violence:    Fear of current or ex partner: No    Emotionally abused: No    Physically abused: No    Forced sexual activity: No  Other Topics Concern  . Not on file  Social History Narrative  . Not on file    Allergies:  No Known Allergies  Medications: Prior to Admission medications   Medication Sig Start Date End Date Taking? Authorizing Provider    meclizine (ANTIVERT) 12.5 MG tablet Take 1 tablet by mouth 3 (three) times daily.   Yes [provider]  nortriptyline (PAMELOR) 25 MG capsule Take 1 capsule by mouth at bedtime. 05/12/18  Yes [provider]  terbinafine (LAMISIL) 250 MG tablet Take 1 tablet (250 mg total) by mouth daily. 04/14/18  Yes Hyatt, Max T, DPM    Physical Exam Vitals: Blood pressure 110/80, pulse 97, height _0  (1.651 m), weight 176 lb 8 oz (80.1 kg).  General: NAD HEENT: normocephalic, anicteric Thyroid: no enlargement, no palpable nodules Pulmonary: No increased work of breathing, CTAB Cardiovascular: RRR, no murmurs, rubs, or gallops Breast: Breasts symmetrical, no tenderness, no palpable nodules or masses, no skin or nipple retraction present, no nipple discharge.  No axillary or supraclavicular lymphadenopathy. Abdomen: Soft, non-tender, non-distended.  Umbilicus without lesions.  No hepatomegaly, splenomegaly or masses palpable. No evidence of hernia  Genitourinary:  External: Normal external female genitalia.  Normal  urethral meatus, normal Bartholin's and Skene's glands.    Vagina: Normal vaginal mucosa, no evidence of prolapse.    Cervix: Grossly normal in appearance, no bleeding  Uterus: Non-enlarged, mobile, normal contour.  No CMT  Adnexa: ovaries non-enlarged, no adnexal masses  Rectal: deferred  Lymphatic: no evidence of inguinal lymphadenopathy Extremities: no edema, erythema, or tenderness Neurologic: Grossly intact Psychiatric: mood appropriate, affect full  Assessment: 32 y.o. G0P0000 here for annual exam and IUD removal.  Plan: Problem List Items Addressed This Visit    None    Visit Diagnoses    Women's annual routine gynecological examination    -  Primary   Encounter for IUD removal       Encounter for initial prescription of injectable contraceptive       Relevant Medications   medroxyPROGESTERone (DEPO-PROVERA) 150 MG/ML injection   Pap smear for cervical  cancer screening       Relevant Orders   Cytology - PAP     1) STI screening was offered and declined.  2) ASCCP guidelines and rationale discussed.  Patient opts for every 3 year screening interval.  3) Contraception - IUD removed today, see separate procedure note. She was previously using DepoProvera with no problems and wishes to restart.  4) Routine healthcare maintenance managed by PCP.  5) Follow up 1 year for routine annual exam.  Avel Sensor, CNM 06/29/2018

## 2018-07-01 LAB — CYTOLOGY - PAP
DIAGNOSIS: NEGATIVE
HPV (WINDOPATH): NOT DETECTED

## 2018-07-02 DIAGNOSIS — Z3009 Encounter for other general counseling and advice on contraception: Secondary | ICD-10-CM | POA: Diagnosis not present

## 2018-07-02 DIAGNOSIS — Z30013 Encounter for initial prescription of injectable contraceptive: Secondary | ICD-10-CM | POA: Diagnosis not present

## 2018-08-16 ENCOUNTER — Ambulatory Visit: Payer: 59 | Admitting: Podiatry

## 2018-12-21 ENCOUNTER — Encounter: Payer: Self-pay | Admitting: Emergency Medicine

## 2018-12-21 ENCOUNTER — Other Ambulatory Visit: Payer: Self-pay

## 2018-12-21 ENCOUNTER — Ambulatory Visit
Admission: EM | Admit: 2018-12-21 | Discharge: 2018-12-21 | Disposition: A | Discharge status: Home / Self Care | Payer: 59 | Attending: Family Medicine | Admitting: Family Medicine

## 2018-12-21 DIAGNOSIS — B9689 Other specified bacterial agents as the cause of diseases classified elsewhere: Secondary | ICD-10-CM | POA: Diagnosis not present

## 2018-12-21 DIAGNOSIS — N760 Acute vaginitis: Secondary | ICD-10-CM

## 2018-12-21 DIAGNOSIS — N390 Urinary tract infection, site not specified: Secondary | ICD-10-CM | POA: Diagnosis not present

## 2018-12-21 LAB — WET PREP, GENITAL
SPERM: NONE SEEN
TRICH WET PREP: NONE SEEN
Yeast Wet Prep HPF POC: NONE SEEN

## 2018-12-21 LAB — URINALYSIS, COMPLETE (UACMP) WITH MICROSCOPIC
BILIRUBIN URINE: NEGATIVE
Glucose, UA: NEGATIVE mg/dL
KETONES UR: NEGATIVE mg/dL
Nitrite: POSITIVE — AB
Protein, ur: 30 mg/dL — AB
Specific Gravity, Urine: 1.02 (ref 1.005–1.030)
WBC, UA: >50 WBC/hpf (ref 0–5)
pH: 7 (ref 5.0–8.0)

## 2018-12-21 MED ORDER — CEPHALEXIN 500 MG PO CAPS: 500.0000 mg | ORAL_CAPSULE | Freq: Two times a day (BID) | ORAL | 0 refills | Status: DC

## 2018-12-21 MED ORDER — METRONIDAZOLE 500 MG PO TABS: 500.0000 mg | ORAL_TABLET | Freq: Two times a day (BID) | ORAL | 0 refills | Status: DC

## 2018-12-21 NOTE — ED Provider Notes (Signed)
[facility]    CSN: [id number] Arrival date & time: 12/21/18  1012     History   Chief Complaint Chief Complaint  Patient presents with  . Dysuria  . Vaginal Discharge    HPI Brandy Krause is a 33 y.o. female.   HPI  33 year old female presents with dysuria is described as burning and a vaginal discharge that started last week.  Dates that it seemed to get better for a while but has returned now.  She is in a monogamous relationship with a female and STDs are not a concern to her.  Has any fever or chills she has had no nausea or vomiting.       Past Medical History:  Diagnosis Date  . At high risk for breast cancer 05/2016   IBIS = 20.6% PER mYRIAD; RECALCULATE AGE 32  . Family history of colon cancer 06/30/2016   mom dx'd at 25; rec scr colonoscopies starting at age 46  . Genetic testing of female 05/2016   NEGATIVE  . History of Papanicolaou smear of cervix 10/06/14; 05/06/16   NEG, CT/GC NEG; NEG  . MS (multiple sclerosis) ([place])   . [facility] sclerosis [facility])     Patient Active Problem List   Diagnosis Date Noted  . Change in bowel habits 09/19/2014  . FH: colon cancer 09/19/2014  . Occult blood in stools 09/19/2014  . Constipation 08/03/2014  . Multiple sclerosis, relapsing-remitting ([place]) 08/03/2014    Past Surgical History:  Procedure Laterality Date  . WISDOM TOOTH EXTRACTION  2005    OB History    Gravida  0   Para  0   Term  0   Preterm  0   AB  0   Living  0     SAB  0   TAB  0   Ectopic  0   Multiple  0   Live Births  0            Home Medications    Prior to Admission medications   Medication Sig Start Date End Date Taking? Authorizing Provider  cephALEXin (KEFLEX) 500 MG capsule Take 1 capsule (500 mg total) by mouth 2 (two) times daily. 12/21/18   Lorin Picket, PA-C  meclizine (ANTIVERT) 12.5 MG tablet Take 1 tablet by mouth 3 (three) times daily.    [provider]  medroxyPROGESTERone  (DEPO-PROVERA) 150 MG/ML injection Inject 1 mL (150 mg total) into the muscle every 3 (three) months. 06/29/18   Rexene Agent, CNM  metroNIDAZOLE (FLAGYL) 500 MG tablet Take 1 tablet (500 mg total) by mouth 2 (two) times daily. 12/21/18   Lorin Picket, PA-C  nortriptyline (PAMELOR) 25 MG capsule Take 1 capsule by mouth at bedtime. 05/12/18   [provider]    Family History Family History  Problem Relation Age of Onset  . Cancer Mother 66       COLON  . Other Mother        cervical dysplasia  . Cancer Father 92       pancreatic, lung  . Lung disease Father   . Breast cancer Paternal Aunt 62    Social History Social History   Tobacco Use  . Smoking status: Never Smoker  . Smokeless tobacco: Never Used  Substance Use Topics  . Alcohol use: Yes    Comment: LIGHT  . Drug use: No     Allergies   Patient has no known allergies.   Review  of Systems Review of Systems  Constitutional: Positive for activity change. Negative for appetite change, chills, fatigue and fever.  Genitourinary: Positive for dysuria, frequency, urgency and vaginal discharge.  All other systems reviewed and are negative.    Physical Exam Triage Vital Signs ED Triage Vitals  Enc Vitals Group     BP 12/21/18 1031 123/85     Pulse Rate 12/21/18 1031 79     Resp 12/21/18 1031 16     Temp 12/21/18 1031 98.1 F (36.7 C)     Temp Source 12/21/18 1031 Oral     SpO2 12/21/18 1031 100 %     Weight 12/21/18 1028 150 lb (68 kg)     Height 12/21/18 1028 '5\' 5"'  (1.651 m)     Head Circumference --      Peak Flow --      Pain Score 12/21/18 1028 0     Pain Loc --      Pain Edu? --      Excl. in [place]? --    No data found.  Updated Vital Signs BP 123/85 (BP Location: Left Arm)   Pulse 79   Temp 98.1 F (36.7 C) (Oral)   Resp 16   Ht '5\' 5"'  (1.651 m)   Wt 150 lb (68 kg)   LMP 12/13/2018 (Approximate)   SpO2 100%   BMI 24.96 kg/m   Visual Acuity Right Eye Distance:   Left Eye  Distance:   Bilateral Distance:    Right Eye Near:   Left Eye Near:    Bilateral Near:     Physical Exam Vitals signs and nursing note reviewed.  Constitutional:      General: She is not in acute distress.    Appearance: Normal appearance. She is not ill-appearing, toxic-appearing or diaphoretic.  HENT:     Head: Normocephalic.     Nose: Nose normal.  Eyes:     General:        Right eye: No discharge.        Left eye: No discharge.     Conjunctiva/sclera: Conjunctivae normal.  Abdominal:     Tenderness: There is no right CVA tenderness or left CVA tenderness.  Musculoskeletal: Normal range of motion.  Skin:    General: Skin is warm and dry.  Neurological:     General: No focal deficit present.     Mental Status: She is alert and oriented to person, place, and time.  Psychiatric:        Mood and Affect: Mood normal.        Behavior: Behavior normal.        Thought Content: Thought content normal.        Judgment: Judgment normal.      UC Treatments / Results  Labs (all labs ordered are listed, but only abnormal results are displayed) Labs Reviewed  WET PREP, GENITAL - Abnormal; Notable for the following components:      Result Value   Clue Cells Wet Prep HPF POC PRESENT (*)    WBC, Wet Prep HPF POC MODERATE (*)    All other components within normal limits  URINALYSIS, COMPLETE (UACMP) WITH MICROSCOPIC - Abnormal; Notable for the following components:   APPearance CLOUDY (*)    Hgb urine dipstick SMALL (*)    Protein, ur 30 (*)    Nitrite POSITIVE (*)    Leukocytes, UA LARGE (*)    Bacteria, UA MANY (*)    All other components within normal limits  URINE CULTURE    EKG None  Radiology No results found.  Procedures Procedures (including critical care time)  Medications Ordered in UC Medications - No data to display  Initial Impression / Assessment and Plan / UC Course  I have reviewed the triage vital signs and the nursing notes.  Pertinent labs &  imaging results that were available during my care of the patient were reviewed by me and considered in my medical decision making (see chart for details).          Drink plenty of fluids.  Do not drink alcohol while taking Flagyl.  No sex while taking the Flagyl.     Final Clinical Impressions(s) / UC Diagnoses   Final diagnoses:  Lower urinary tract infectious disease  BV (bacterial vaginosis)     Discharge Instructions     Drink plenty of fluids.  Do not drink alcohol while taking Flagyl.  No sex while taking the Flagyl.   ED Prescriptions    Medication Sig Dispense Auth. Provider   cephALEXin (KEFLEX) 500 MG capsule Take 1 capsule (500 mg total) by mouth 2 (two) times daily. 10 capsule Crecencio Mc P, PA-C   metroNIDAZOLE (FLAGYL) 500 MG tablet Take 1 tablet (500 mg total) by mouth 2 (two) times daily. 14 tablet Lorin Picket, PA-C     Controlled Substance Prescriptions [place] Controlled Substance Registry consulted? Not Applicable   Lorin Picket, PA-C 12/21/18 2122

## 2018-12-21 NOTE — Discharge Instructions (Addendum)
Drink plenty of fluids.  Do not drink alcohol while taking Flagyl.  No sex while taking the Flagyl.

## 2018-12-21 NOTE — ED Triage Notes (Signed)
Patient c/o burning when urinating and vaginal discharge that started last week.

## 2018-12-23 ENCOUNTER — Telehealth (HOSPITAL_COMMUNITY): Payer: Self-pay | Admitting: Emergency Medicine

## 2018-12-23 LAB — URINE CULTURE: Culture: >=100000 — AB

## 2018-12-23 NOTE — Telephone Encounter (Signed)
Urine culture was positive for STAPHYLOCOCCUS AUREUS and was given Keflex  at urgent care visit. Attempted to reach patient. No answer at this time

## 2019-06-28 ENCOUNTER — Ambulatory Visit
Admission: EM | Admit: 2019-06-28 | Discharge: 2019-06-28 | Disposition: A | Discharge status: Home / Self Care | Payer: 59 | Attending: Family Medicine | Admitting: Family Medicine

## 2019-06-28 ENCOUNTER — Other Ambulatory Visit: Payer: Self-pay

## 2019-06-28 DIAGNOSIS — R6884 Jaw pain: Secondary | ICD-10-CM | POA: Diagnosis not present

## 2019-06-28 IMAGING — RF DG FLUORO GUIDE LUMBAR PUNCTURE
1 series · 1 of 1 positions shown · non-contrast
Comparison: none

CLINICAL DATA: Headache, multiple scleroses, relapsing-remitting.

[Series 1: fluoro_iodine 2fps_bw · 0.18mm/px · 1 of 1 slices shown]
[im 1/1]
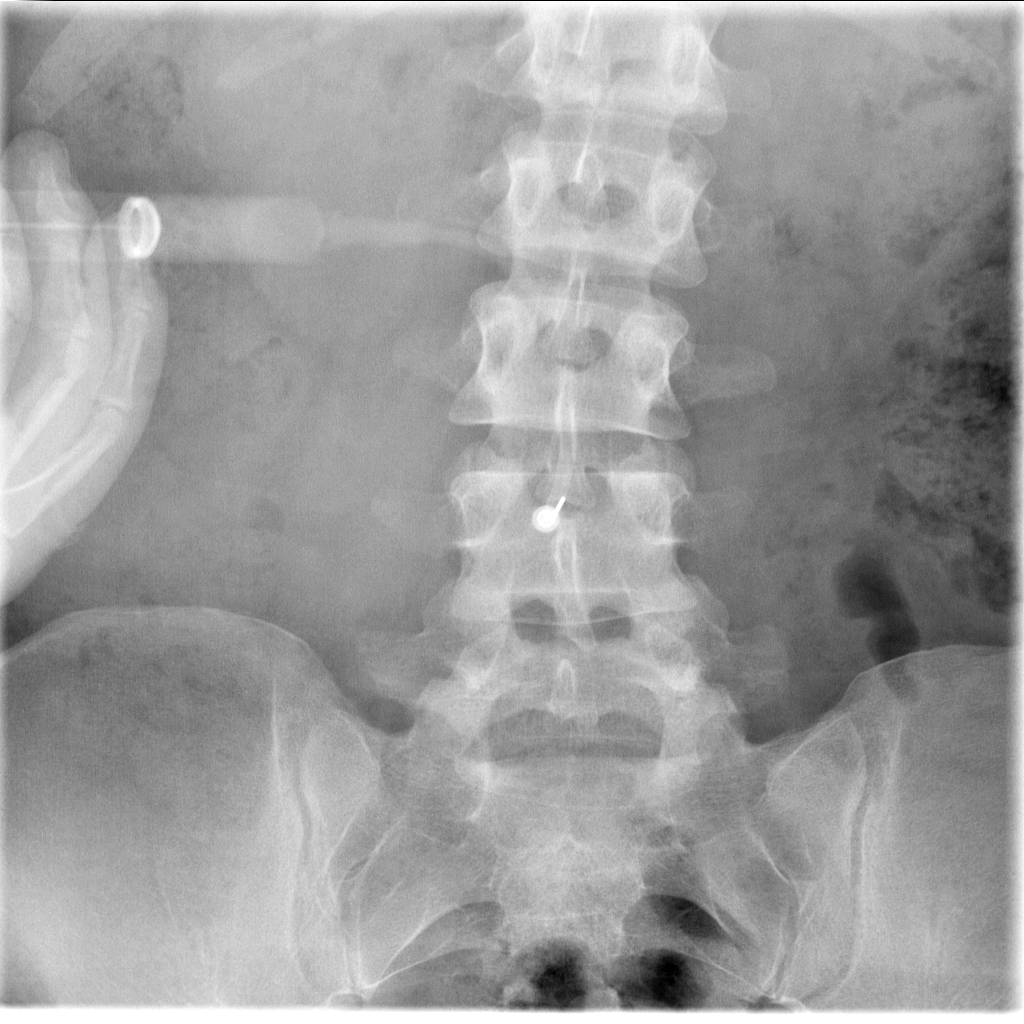

[1 of 1 positions shown; findings below may reference images not displayed]

EXAM:
DIAGNOSTIC LUMBAR PUNCTURE UNDER FLUOROSCOPIC GUIDANCE

FLUOROSCOPY TIME:  Fluoroscopy Time:  0 minutes, 30 seconds

Radiation Exposure Index (if provided by the fluoroscopic device):
Not available

Number of Acquired Spot Images: 1

PROCEDURE:
The anticipated procedure was discussed with the patient. Risks
including bleeding, infection, and increased pain and headache were
discussed and their management outlined. Informed consent was
obtained from the patient prior to the procedure. There was no
contraindication to the use of cutaneous antiseptics or injectable
lidocaine.

With the patient prone, the lower back was prepped with Betadine. 1%
Lidocaine was used for local anesthesia. Lumbar puncture was
performed at the L 3 4 level using a 20 gauge needle with return of
clear CSF with an opening pressure of 15 cm water. Five ml of CSF
were obtained for laboratory studies. The patient tolerated the
procedure well and there were no apparent complications.
IMPRESSION: Fluoroscopically directed lumbar puncture with acquisition of spinal
fluid for laboratory evaluation. The patient tolerated the procedure
well and was sent to [facility] in stable condition.

## 2019-06-28 MED ORDER — TIZANIDINE HCL 4 MG PO TABS: 4.0000 mg | ORAL_TABLET | Freq: Three times a day (TID) | ORAL | 0 refills | Status: DC | PRN

## 2019-06-28 NOTE — Discharge Instructions (Signed)
Medication as needed.  If persists, discuss with neurology.  Take care  Dr. Lacinda Axon

## 2019-06-28 NOTE — ED Triage Notes (Signed)
Patient states that she has noticed bilateral facial swelling and puffiness x 4 days.

## 2019-06-28 NOTE — ED Provider Notes (Signed)
[facility]    CSN: [id number] Arrival date & time: 06/28/19  1016  History   Chief Complaint Chief Complaint  Patient presents with  . Facial Swelling    HPI  33 year old female presents with concern for facial swelling and "jaw tightness".  Patient reports that she has had the symptoms for the past few days.  She states that her jaw feels tight.  She endorses some discomfort when she opens her mouth widely.  Patient feels like her face is swollen bilaterally.  Denies dental pain.  No fever.  No known inciting factor.  No known relieving factors.  No other associated symptoms.  No other complaints.  PMH, Surgical Hx, Family Hx, Social History reviewed and updated as below.  Past Medical History:  Diagnosis Date  . At high risk for breast cancer 05/2016   IBIS = 20.6% PER mYRIAD; RECALCULATE AGE 89  . Family history of colon cancer 06/30/2016   mom dx'd at 68; rec scr colonoscopies starting at age 17  . Genetic testing of female 05/2016   NEGATIVE  . History of Papanicolaou smear of cervix 10/06/14; 05/06/16   NEG, CT/GC NEG; NEG  . MS (multiple sclerosis) ([place])   . Multiple sclerosis [facility])     Patient Active Problem List   Diagnosis Date Noted  . Change in bowel habits 09/19/2014  . FH: colon cancer 09/19/2014  . Occult blood in stools 09/19/2014  . Constipation 08/03/2014  . Multiple sclerosis, relapsing-remitting ([place]) 08/03/2014    Past Surgical History:  Procedure Laterality Date  . WISDOM TOOTH EXTRACTION  2005    OB History    Gravida  0   Para  0   Term  0   Preterm  0   AB  0   Living  0     SAB  0   TAB  0   Ectopic  0   Multiple  0   Live Births  0            Home Medications    Prior to Admission medications   Medication Sig Start Date End Date Taking? Authorizing Provider  minoxidil (ROGAINE) 2 % external solution Apply topically 2 (two) times daily.   Yes [provider]  nortriptyline (PAMELOR) 25  MG capsule Take 1 capsule by mouth at bedtime. 05/12/18  Yes [provider]  tiZANidine (ZANAFLEX) 4 MG tablet Take 1 tablet (4 mg total) by mouth every 8 (eight) hours as needed for muscle spasms (Jaw pain/tightness). 06/28/19   Coral Spikes, DO  medroxyPROGESTERone (DEPO-PROVERA) 150 MG/ML injection Inject 1 mL (150 mg total) into the muscle every 3 (three) months. 06/29/18 06/28/19  Rexene Agent, CNM    Family History Family History  Problem Relation Age of Onset  . Cancer Mother 49       COLON  . Other Mother        cervical dysplasia  . Cancer Father 39       pancreatic, lung  . Lung disease Father   . Breast cancer Paternal Aunt 53    Social History Social History   Tobacco Use  . Smoking status: Never Smoker  . Smokeless tobacco: Never Used  Substance Use Topics  . Alcohol use: Yes    Comment: LIGHT  . Drug use: No     Allergies   Patient has no known allergies.   Review of Systems Review of Systems  Constitutional: Negative.   HENT:  Facial swelling, jaw "tightness"    Physical Exam Triage Vital Signs ED Triage Vitals  Enc Vitals Group     BP 06/28/19 1028 (!) 138/97     Pulse Rate 06/28/19 1028 97     Resp 06/28/19 1028 17     Temp 06/28/19 1028 98.3 F (36.8 C)     Temp Source 06/28/19 1028 Oral     SpO2 06/28/19 1028 100 %     Weight 06/28/19 1026 170 lb (77.1 kg)     Height 06/28/19 1026 _0  (1.651 m)     Head Circumference --      Peak Flow --      Pain Score 06/28/19 1026 0     Pain Loc --      Pain Edu? --      Excl. in [place]? --    Updated Vital Signs BP (!) 138/97 (BP Location: Left Arm)   Pulse 97   Temp 98.3 F (36.8 C) (Oral)   Resp 17   Ht _1  (1.651 m)   Wt 77.1 kg   LMP 06/14/2019   SpO2 100%   BMI 28.29 kg/m   Visual Acuity Right Eye Distance:   Left Eye Distance:   Bilateral Distance:    Right Eye Near:   Left Eye Near:    Bilateral Near:     Physical Exam Vitals signs and nursing note  reviewed.  Constitutional:      General: She is not in acute distress.    Appearance: Normal appearance.  HENT:     Head: Normocephalic and atraumatic.     Mouth/Throat:     Mouth: Mucous membranes are moist.     Pharynx: Oropharynx is clear. No posterior oropharyngeal erythema.  Eyes:     General:        Right eye: No discharge.        Left eye: No discharge.     Conjunctiva/sclera: Conjunctivae normal.  Cardiovascular:     Rate and Rhythm: Normal rate and regular rhythm.  Pulmonary:     Effort: Pulmonary effort is normal. No respiratory distress.  Neurological:     Mental Status: She is alert.  Psychiatric:        Mood and Affect: Mood normal.        Behavior: Behavior normal.    UC Treatments / Results  Labs (all labs ordered are listed, but only abnormal results are displayed) Labs Reviewed - No data to display  EKG   Radiology No results found.  Procedures Procedures (including critical care time)  Medications Ordered in UC Medications - No data to display  Initial Impression / Assessment and Plan / UC Course  I have reviewed the triage vital signs and the nursing notes.  Pertinent labs & imaging results that were available during my care of the patient were reviewed by me and considered in my medical decision making (see chart for details).    33 year old female presents with reported facial swelling and jaw tightness/pain.  I do not appreciate any swelling on exam.  She does have slight difficulty opening her mouth widely.  Possibly related to bruxism.  Zanaflex as prescribed.  If persists, should discuss with neurology.  Final Clinical Impressions(s) / UC Diagnoses   Final diagnoses:  Jaw pain     Discharge Instructions     Medication as needed.  If persists, discuss with neurology.  Take care  Dr. Lacinda Axon    ED Prescriptions    Medication  Sig Dispense Auth. Provider   tiZANidine (ZANAFLEX) 4 MG tablet Take 1 tablet (4 mg total) by mouth  every 8 (eight) hours as needed for muscle spasms (Jaw pain/tightness). 30 tablet Coral Spikes, DO     Controlled Substance Prescriptions [place] Controlled Substance Registry consulted? Not Applicable   Coral Spikes, DO 06/28/19 1053

## 2019-10-16 ENCOUNTER — Other Ambulatory Visit: Payer: Self-pay

## 2019-10-16 ENCOUNTER — Encounter: Payer: Self-pay | Admitting: Emergency Medicine

## 2019-10-16 ENCOUNTER — Ambulatory Visit
Admission: EM | Admit: 2019-10-16 | Discharge: 2019-10-16 | Disposition: A | Discharge status: Home / Self Care | Payer: 59 | Attending: Emergency Medicine | Admitting: Emergency Medicine

## 2019-10-16 DIAGNOSIS — Z20828 Contact with and (suspected) exposure to other viral communicable diseases: Secondary | ICD-10-CM

## 2019-10-16 DIAGNOSIS — Z20822 Contact with and (suspected) exposure to covid-19: Secondary | ICD-10-CM

## 2019-10-16 NOTE — ED Triage Notes (Signed)
Patient states that she was around her brother on Wed who recently tested positive for COVID.  Patient denies any symptoms.

## 2019-10-16 NOTE — ED Provider Notes (Signed)
[facility] ____________________________________________  Time seen: Approximately 3:29 PM  I have reviewed the triage vital signs and the nursing notes.   HISTORY  Chief Complaint COVID Exposure   HPI Brandy Krause is a 33 y.o. female presenting for COVID-19 testing.  Patient reports her brother just tested positive this past week for COVID-19 she was around him prior to his test being positive.  Patient denies complaints.  Denies chest pain, shortness of breath, abdominal pain, vomiting, diarrhea, sore throat, congestion, changes in taste or smell.  States feels well.  Denies aggravating or alleviating factors.   Past Medical History:  Diagnosis Date  . At high risk for breast cancer 05/2016   IBIS = 20.6% PER mYRIAD; RECALCULATE AGE 27  . Family history of colon cancer 06/30/2016   mom dx'd at 24; rec scr colonoscopies starting at age 17  . Genetic testing of female 05/2016   NEGATIVE  . History of Papanicolaou smear of cervix 10/06/14; 05/06/16   NEG, CT/GC NEG; NEG  . MS (multiple sclerosis) ([place])   . Multiple sclerosis [facility])     Patient Active Problem List   Diagnosis Date Noted  . Change in bowel habits 09/19/2014  . FH: colon cancer 09/19/2014  . Occult blood in stools 09/19/2014  . Constipation 08/03/2014  . Multiple sclerosis, relapsing-remitting ([place]) 08/03/2014    Past Surgical History:  Procedure Laterality Date  . WISDOM TOOTH EXTRACTION  2005     No current facility-administered medications for this encounter.   Current Outpatient Medications:  .  nortriptyline (PAMELOR) 25 MG capsule, Take 1 capsule by mouth at bedtime., Disp: , Rfl:  .  minoxidil (ROGAINE) 2 % external solution, Apply topically 2 (two) times daily., Disp: , Rfl:  .  tiZANidine (ZANAFLEX) 4 MG tablet, Take 1 tablet (4 mg total) by mouth every 8 (eight) hours as needed for muscle spasms (Jaw pain/tightness)., Disp: 30 tablet, Rfl: 0  Allergies Patient has no known  allergies.  Family History  Problem Relation Age of Onset  . Cancer Mother 48       COLON  . Other Mother        cervical dysplasia  . Cancer Father 25       pancreatic, lung  . Lung disease Father   . Breast cancer Paternal Aunt 62    Social History Social History   Tobacco Use  . Smoking status: Never Smoker  . Smokeless tobacco: Never Used  Substance Use Topics  . Alcohol use: Yes    Comment: LIGHT  . Drug use: No    Review of Systems Constitutional: No fever ENT: No sore throat. Cardiovascular: Denies chest pain. Respiratory: Denies shortness of breath. Gastrointestinal: No abdominal pain.  No nausea, no vomiting.  No diarrhea.   Genitourinary: Negative for dysuria. Musculoskeletal: Negative for back pain. Skin: Negative for rash.   ____________________________________________   PHYSICAL EXAM:  VITAL SIGNS: ED Triage Vitals  Enc Vitals Group     BP 10/16/19 1524 120/83     Pulse Rate 10/16/19 1524 86     Resp 10/16/19 1524 16     Temp 10/16/19 1524 98 F (36.7 C)     Temp Source 10/16/19 1524 Oral     SpO2 10/16/19 1524 100 %     Weight 10/16/19 1520 170 lb (77.1 kg)     Height 10/16/19 1520 '5\' 5"'  (1.651 m)     Head Circumference --      Peak Flow --  Pain Score 10/16/19 1520 0     Pain Loc --      Pain Edu? --      Excl. in [place]? --     Constitutional: Alert and oriented. Well appearing and in no acute distress. Eyes: Conjunctivae are normal.  ENT      Head: Normocephalic and atraumatic. Cardiovascular: Normal heart rate.  Respiratory: Normal respiratory effort without tachypnea nor retractions.  Musculoskeletal:Steady gait.  Neurologic:  Normal speech and language.Speech is normal. No gait instability.  Skin:  Skin is warm, dry. Psychiatric: Mood and affect are normal. Speech and behavior are normal. Patient exhibits appropriate insight and judgment   ___________________________________________   LABS (all labs ordered are listed,  but only abnormal results are displayed)  Labs Reviewed  NOVEL CORONAVIRUS, NAA (HOSP ORDER, SEND-OUT TO REF LAB; TAT 18-24 HRS)   ____________________________________________   PROCEDURES Procedures    INITIAL IMPRESSION / ASSESSMENT AND PLAN / ED COURSE  Pertinent labs & imaging results that were available during my care of the patient were reviewed by me and considered in my medical decision making (see chart for details).  Well-appearing patient.  No acute distress.  COVID-19 exposure, testing completed and advice given.  Work note given.  Discussed follow up and return parameters including no resolution or any worsening concerns. Patient verbalized understanding and agreed to plan.   ____________________________________________   FINAL CLINICAL IMPRESSION(S) / ED DIAGNOSES  Final diagnoses:  Close exposure to COVID-19 virus     ED Discharge Orders    None       Note: This dictation was prepared with Dragon dictation along with smaller phrase technology. Any transcriptional errors that result from this process are unintentional.         Marylene Land, NP 10/16/19 1531

## 2019-10-18 LAB — NOVEL CORONAVIRUS, NAA (HOSP ORDER, SEND-OUT TO REF LAB; TAT 18-24 HRS): SARS-CoV-2, NAA: NOT DETECTED

## 2019-10-20 ENCOUNTER — Encounter: Payer: Self-pay | Admitting: Emergency Medicine

## 2019-10-20 ENCOUNTER — Other Ambulatory Visit: Payer: Self-pay

## 2019-10-20 ENCOUNTER — Ambulatory Visit
Admission: EM | Admit: 2019-10-20 | Discharge: 2019-10-20 | Disposition: A | Discharge status: Home / Self Care | Payer: 59 | Attending: Family Medicine | Admitting: Family Medicine

## 2019-10-20 DIAGNOSIS — Z7189 Other specified counseling: Secondary | ICD-10-CM

## 2019-10-20 DIAGNOSIS — Z20822 Contact with and (suspected) exposure to covid-19: Secondary | ICD-10-CM

## 2019-10-20 DIAGNOSIS — Z20828 Contact with and (suspected) exposure to other viral communicable diseases: Secondary | ICD-10-CM

## 2019-10-20 NOTE — ED Provider Notes (Signed)
[place], [place]   Name: Brandy Krause DOB: 1986-04-12 MRN: [id number] CSN: [id number] PCP: Juline Patch, MD  Arrival date and time:  10/20/19 1610  Chief Complaint:  COVID testing   NOTE: Prior to seeing the patient today, I have reviewed the triage nursing documentation and vital signs. Clinical staff has updated patient's PMH/PSHx, current medication list, and drug allergies/intolerances to ensure comprehensive history available to assist in medical decision making.   History:   HPI: Brandy Krause is a 33 y.o. female who presents today with complaints of recent direct exposure to SARS-CoV-2 (novel coronavirus). Known exposure reported to have occurred on Thanksgiving. Patient advises that the person she was exposed to was her brother, and he received positive results on Sunday. Patient was seen in clinic and had molecular PCR testing here on 10/16/2019. Patient was advised by her job ([employer] in [place]) that she was tested too early and that she would have to have to be re-tested 7 days from her exposure prior to being allowed to return to work. Patient presents today with no symptoms; no cough, fevers, or other symptoms commonly associated with SARS-CoV-2. She advises that she feels generally well.   Past Medical History:  Diagnosis Date  . At high risk for breast cancer 05/2016   IBIS = 20.6% PER mYRIAD; RECALCULATE AGE 33  . Family history of colon cancer 06/30/2016   mom dx'd at 67; rec scr colonoscopies starting at age 87  . Genetic testing of female 05/2016   NEGATIVE  . History of Papanicolaou smear of cervix 10/06/14; 05/06/16   NEG, CT/GC NEG; NEG  . MS (multiple sclerosis) ([place])   . Multiple sclerosis ([place])     Past Surgical History:  Procedure Laterality Date  . WISDOM TOOTH EXTRACTION  2005    Family History  Problem Relation Age of Onset  . Cancer Mother 82       COLON  . Other Mother        cervical dysplasia  . Cancer Father 32       pancreatic, lung  .  Lung disease Father   . Breast cancer Paternal Aunt 96    Social History   Tobacco Use  . Smoking status: Never Smoker  . Smokeless tobacco: Never Used  Substance Use Topics  . Alcohol use: Yes    Comment: LIGHT  . Drug use: No    Patient Active Problem List   Diagnosis Date Noted  . Change in bowel habits 09/19/2014  . FH: colon cancer 09/19/2014  . Occult blood in stools 09/19/2014  . Constipation 08/03/2014  . Multiple sclerosis, relapsing-remitting ([place]) 08/03/2014    Home Medications:    Current Meds  Medication Sig  . nortriptyline (PAMELOR) 25 MG capsule Take 1 capsule by mouth at bedtime.    Allergies:   Patient has no known allergies.  Review of Systems (ROS): Review of Systems  Constitutional: Negative for fatigue and fever.  HENT: Negative for congestion, ear pain, postnasal drip, rhinorrhea, sinus pressure, sinus pain, sneezing and sore throat.   Eyes: Negative for pain, discharge and redness.  Respiratory: Negative for cough, chest tightness and shortness of breath.   Cardiovascular: Negative for chest pain and palpitations.  Gastrointestinal: Negative for abdominal pain, diarrhea, nausea and vomiting.  Musculoskeletal: Negative for arthralgias, back pain, myalgias and neck pain.  Skin: Negative for color change, pallor and rash.  Neurological: Negative for dizziness, syncope, weakness and headaches.  Hematological: Negative for adenopathy.  Vital Signs: Today's Vitals   10/20/19 1623  BP: 134/89  Pulse: 91  Resp: 18  Temp: 98.1 F (36.7 C)  TempSrc: Oral  SpO2: 100%  Weight: 170 lb (77.1 kg)  Height: '5\' 5"'  (1.651 m)  PainSc: 0-No pain    Physical Exam: Physical Exam  Constitutional: She is oriented to person, place, and time and well-developed, well-nourished, and in no distress. No distress.  HENT:  Head: Normocephalic and atraumatic.  Right Ear: Tympanic membrane normal.  Left Ear: Tympanic membrane normal.  Nose: Nose normal.   Mouth/Throat: Uvula is midline, oropharynx is clear and moist and mucous membranes are normal.  Eyes: Pupils are equal, round, and reactive to light. Conjunctivae and EOM are normal.  Neck: Normal range of motion. Neck supple.  Cardiovascular: Normal rate, regular rhythm, normal heart sounds and intact distal pulses. Exam reveals no gallop and no friction rub.  No murmur heard. Pulmonary/Chest: Effort normal. No respiratory distress. She has no decreased breath sounds. She has no wheezes. She has no rhonchi. She has no rales.  Abdominal: Soft. Normal appearance and bowel sounds are normal. She exhibits no distension. There is no abdominal tenderness.  Musculoskeletal: Normal range of motion.  Neurological: She is alert and oriented to person, place, and time. Gait normal.  Skin: Skin is warm and dry. No rash noted. She is not diaphoretic.  Psychiatric: Mood, memory, affect and judgment normal.  Nursing note and vitals reviewed.   Urgent Care Treatments / Results:   LABS: PLEASE NOTE: all labs that were ordered this encounter are listed, however only abnormal results are displayed. Labs Reviewed  NOVEL CORONAVIRUS, NAA (HOSP ORDER, SEND-OUT TO REF LAB; TAT 18-24 HRS)    EKG: -None  RADIOLOGY: No results found.  PROCEDURES: Procedures  MEDICATIONS RECEIVED THIS VISIT: Medications - No data to display  PERTINENT CLINICAL COURSE NOTES/UPDATES:   Initial Impression / Assessment and Plan / Urgent Care Course:  Pertinent labs & imaging results that were available during my care of the patient were personally reviewed by me and considered in my medical decision making (see lab/imaging section of note for values and interpretations).  Brandy Krause is a 33 y.o. female who presents to [facility] today with complaints of COVID testing   Patient overall well appearing and in no acute distress today in clinic. Presenting symptoms (see HPI) and exam as documented above. She  presents following a direct exposure to SARS-CoV-2 (novel coronavirus) on Thanksgiving. She tested negative on 10/16/2019. Discussed typical symptom constellation.  Reviewed potential for infection with recent close contact. Employer is requiring repeat testing 7 days from exposure prior to allowing patient to return to work. Given exposure and potential for infection, repeat testing is reasonable. SARS-CoV-2 swab collected by certified clinical staff. Discussed variable turn around times associated with testing, as swabs are being processed at [facility], and have been between 2-5 days to come back. She was advised to self quarantine, per [facility] DHHS guidelines, until negative results received.   Current clinical condition warrants patient being out of work in order to quarantine while waiting for testing results. She was provided with the appropriate documentation to provide to her place of employment that will allow for her to RTW on 10/22/2019 with no restrictions. RTW is contingent on her SARS-CoV-2 test results being reviewed as negative.    Discussed follow up with primary care physician should she develop any concerning symptoms. I have reviewed the follow up and strict return precautions for  any new or worsening symptoms. Patient is aware of symptoms that would be deemed urgent/emergent, and would thus require further evaluation either here or in the emergency department. At the time of discharge, he verbalized understanding and consent with the discharge plan as it was reviewed with him. All questions were fielded by provider and/or clinic staff prior to patient discharge.    Final Clinical Impressions / Urgent Care Diagnoses:   Final diagnoses:  Close exposure to COVID-19 virus  Encounter for laboratory testing for COVID-19 virus  Advice given about COVID-19 virus infection    New Prescriptions:  [place] Controlled Substance Registry consulted? Not Applicable  No orders of the defined types were  placed in this encounter.   Recommended Follow up Care:  Patient encouraged to follow up with the following provider within the specified time frame, or sooner as dictated by the severity of her symptoms. As always, she was instructed that for any urgent/emergent care needs, she should seek care either here or in the emergency department for more immediate evaluation.  Follow-up Information    Juline Patch, MD.   Specialty: Family Medicine Why: As needed Contact information: [address] [place] [address] [phone]         NOTE: This note was prepared using Dragon dictation software along with smaller phrase technology. Despite my best ability to proofread, there is the potential that transcriptional errors may still occur from this process, and are completely unintentional.    Karen Kitchens, NP 10/20/19 [phone]

## 2019-10-20 NOTE — Discharge Instructions (Signed)
It was very nice seeing you today in clinic. Thank you for entrusting me with your care.   You were tested for SARS-CoV-2 (novel coronavirus) today. Testing is performed by an outside lab ([facility]) and has variable turn around times ranging between 2-5 days. Current recommendations from the the CDC and [place] DHHS require that you remain out of work in order to quarantine at home until negative test results are have been received. In the event that your test results are positive, you will be contacted with further directives. These measures are being implemented out of an abundance of caution to prevent transmission and spread during the current SARS-CoV-2 pandemic.   If you develop any worsening symptoms or concerns, make arrangements to follow up with your regular doctor. If your symptoms are severe, please seek follow up care in the ER. Please remember, our [facility] providers are "right here with you" when you need us.   Again, it was my pleasure to take care of you today. Thank you for choosing our clinic. I hope that you start to feel better quickly.   Odette Watanabe, MSN, APRN, FNP-C, CEN Advanced Practice Provider [facility]  

## 2019-10-20 NOTE — ED Triage Notes (Signed)
Patient had positive exposure to her brother on Thanksgiving. She has no symptoms and tested negative on 11/29. Her job is making her be retested.

## 2019-10-21 LAB — NOVEL CORONAVIRUS, NAA (HOSP ORDER, SEND-OUT TO REF LAB; TAT 18-24 HRS): SARS-CoV-2, NAA: NOT DETECTED

## 2019-10-22 ENCOUNTER — Ambulatory Visit
Admission: EM | Admit: 2019-10-22 | Discharge: 2019-10-22 | Disposition: A | Discharge status: Home / Self Care | Payer: 59 | Attending: Emergency Medicine | Admitting: Emergency Medicine

## 2019-10-22 ENCOUNTER — Other Ambulatory Visit: Payer: Self-pay

## 2019-10-22 ENCOUNTER — Encounter: Payer: Self-pay | Admitting: Emergency Medicine

## 2019-10-22 DIAGNOSIS — B373 Candidiasis of vulva and vagina: Secondary | ICD-10-CM | POA: Diagnosis not present

## 2019-10-22 DIAGNOSIS — B9689 Other specified bacterial agents as the cause of diseases classified elsewhere: Secondary | ICD-10-CM

## 2019-10-22 DIAGNOSIS — N760 Acute vaginitis: Secondary | ICD-10-CM | POA: Diagnosis not present

## 2019-10-22 DIAGNOSIS — N390 Urinary tract infection, site not specified: Secondary | ICD-10-CM

## 2019-10-22 DIAGNOSIS — B3731 Acute candidiasis of vulva and vagina: Secondary | ICD-10-CM

## 2019-10-22 LAB — URINALYSIS, COMPLETE (UACMP) WITH MICROSCOPIC
Bilirubin Urine: NEGATIVE
Glucose, UA: NEGATIVE mg/dL
Hgb urine dipstick: NEGATIVE
Ketones, ur: NEGATIVE mg/dL
Leukocytes,Ua: NEGATIVE
Nitrite: POSITIVE — AB
Protein, ur: NEGATIVE mg/dL
Specific Gravity, Urine: 1.02 (ref 1.005–1.030)
pH: 8 (ref 5.0–8.0)

## 2019-10-22 LAB — WET PREP, GENITAL
Sperm: NONE SEEN
Trich, Wet Prep: NONE SEEN

## 2019-10-22 MED ORDER — FLUCONAZOLE 150 MG PO TABS: 150.0000 mg | ORAL_TABLET | Freq: Once | ORAL | 1 refills | Status: AC

## 2019-10-22 MED ORDER — NITROFURANTOIN MONOHYD MACRO 100 MG PO CAPS: 100.0000 mg | ORAL_CAPSULE | Freq: Two times a day (BID) | ORAL | 0 refills | Status: DC

## 2019-10-22 MED ORDER — METRONIDAZOLE 500 MG PO TABS: 500.0000 mg | ORAL_TABLET | Freq: Two times a day (BID) | ORAL | 0 refills | Status: AC

## 2019-10-22 MED ORDER — PHENAZOPYRIDINE HCL 200 MG PO TABS: 200.0000 mg | ORAL_TABLET | Freq: Three times a day (TID) | ORAL | 0 refills | Status: DC | PRN

## 2019-10-22 NOTE — ED Provider Notes (Signed)
HPI  SUBJECTIVE:  Brandy Krause is a 33 y.o. female who presents with over a month of odorous vaginal discharge.  She reports "burning" when she urinates during this time.  She reports some vaginal itching.  No vomiting, fevers, back, pelvic, abdominal pain, genital rash, labial swelling.  No urinary urgency, frequency, cloudy odorous urine, hematuria.  She is in a long-term monogamous relationship with a female who is asymptomatic, STDs or not a concern today.  She tried oregano oil orally without improvement in her symptoms.  There are no aggravating or alleviating factors.  No recent antibiotics.  She states this may have happened because she switched to [employer].  He has a past medical history of frequent BV, and states this feels identical to that.  No history of gonorrhea, chlamydia, HIV, HSV, syphilis, trichomonas, yeast infections, diabetes.  LMP: 2 weeks ago.  Denies possibility being pregnant.  PMD: Juline Patch, MD  GYN: [facility]   Past Medical History:  Diagnosis Date  . At high risk for breast cancer 05/2016   IBIS = 20.6% PER mYRIAD; RECALCULATE AGE 20  . Family history of colon cancer 06/30/2016   mom dx'd at 37; rec scr colonoscopies starting at age 28  . Genetic testing of female 05/2016   NEGATIVE  . History of Papanicolaou smear of cervix 10/06/14; 05/06/16   NEG, CT/GC NEG; NEG  . MS (multiple sclerosis) ([place])   . Multiple sclerosis ([place])     Past Surgical History:  Procedure Laterality Date  . FOOT SURGERY Right    bunion removal  . WISDOM TOOTH EXTRACTION  2005    Family History  Problem Relation Age of Onset  . Cancer Mother 47       COLON  . Other Mother        cervical dysplasia  . Cancer Father 68       pancreatic, lung  . Lung disease Father   . Breast cancer Paternal Aunt 60    Social History   Tobacco Use  . Smoking status: Never Smoker  . Smokeless tobacco: Never Used  Substance Use Topics  . Alcohol use: Yes    Comment:  LIGHT  . Drug use: No    No current facility-administered medications for this encounter.   Current Outpatient Medications:  .  nortriptyline (PAMELOR) 25 MG capsule, Take 1 capsule by mouth at bedtime., Disp: , Rfl:  .  fluconazole (DIFLUCAN) 150 MG tablet, Take 1 tablet (150 mg total) by mouth once for 1 dose. 1 tab po x 1. May repeat in 72 hours if no improvement, Disp: 2 tablet, Rfl: 1 .  metroNIDAZOLE (FLAGYL) 500 MG tablet, Take 1 tablet (500 mg total) by mouth 2 (two) times daily for 7 days., Disp: 14 tablet, Rfl: 0 .  phenazopyridine (PYRIDIUM) 200 MG tablet, Take 1 tablet (200 mg total) by mouth 3 (three) times daily as needed for pain., Disp: 6 tablet, Rfl: 0  No Known Allergies   ROS  As noted in HPI.   Physical Exam  BP 118/79 (BP Location: Left Arm)   Pulse 87   Temp 97.9 F (36.6 C) (Oral)   Resp 18   Ht '5\' 5"'  (1.651 m)   Wt 77.1 kg   LMP 10/09/2019 (Approximate)   SpO2 100%   BMI 28.29 kg/m   Constitutional: Well developed, well nourished, no acute distress Eyes:  EOMI, conjunctiva normal bilaterally HENT: Normocephalic, atraumatic,mucus membranes moist Respiratory: Normal inspiratory effort Cardiovascular:  Normal rate GI: nondistended soft, nontender. No suprapubic, flank, lower quadrant tenderness  back: No CVA tenderness GU: Deferred skin: No rash, skin intact Musculoskeletal: no deformities Neurologic: Alert & oriented x 3, no focal neuro deficits Psychiatric: Speech and behavior appropriate   ED Course   Medications - No data to display  Orders Placed This Encounter  Procedures  . Wet prep, genital    Standing Status:   Standing    Number of Occurrences:   1  . Urine culture    Standing Status:   Standing    Number of Occurrences:   1    Order Specific Question:   List patient's active antibiotics    Answer:   none    Order Specific Question:   Patient immune status    Answer:   Normal  . Urinalysis, Complete w Microscopic     Standing Status:   Standing    Number of Occurrences:   1    Results for orders placed or performed during the hospital encounter of 10/22/19 (from the past 24 hour(s))  Urinalysis, Complete w Microscopic     Status: Abnormal   Collection Time: 10/22/19 10:44 AM  Result Value Ref Range   Color, Urine YELLOW YELLOW   APPearance CLEAR CLEAR   Specific Gravity, Urine 1.020 1.005 - 1.030   pH 8.0 5.0 - 8.0   Glucose, UA NEGATIVE NEGATIVE mg/dL   Hgb urine dipstick NEGATIVE NEGATIVE   Bilirubin Urine NEGATIVE NEGATIVE   Ketones, ur NEGATIVE NEGATIVE mg/dL   Protein, ur NEGATIVE NEGATIVE mg/dL   Nitrite POSITIVE (A) NEGATIVE   Leukocytes,Ua NEGATIVE NEGATIVE   Squamous Epithelial / LPF 0-5 0 - 5   WBC, UA 0-5 0 - 5 WBC/hpf   RBC / HPF 0-5 0 - 5 RBC/hpf   Bacteria, UA MANY (A) NONE SEEN  Wet prep, genital     Status: Abnormal   Collection Time: 10/22/19 10:44 AM   Specimen: Vaginal  Result Value Ref Range   Yeast Wet Prep HPF POC PRESENT (A) NONE SEEN   Trich, Wet Prep NONE SEEN NONE SEEN   Clue Cells Wet Prep HPF POC PRESENT (A) NONE SEEN   WBC, Wet Prep HPF POC MODERATE (A) NONE SEEN   Sperm NONE SEEN    No results found.  ED Clinical Impression  1. BV (bacterial vaginosis)   2. Yeast vaginitis      ED Assessment/Plan  Did not test for STDs because patient is low risk and it was not a concern today.  Patient with BV and yeast.  Will send home with Flagyl and Diflucan.  Also sending home with some Pyridium because she reports pain with urination.  Discussed with patient that if that dysuria is from vaginal irritation the Pyridium will not help.  Advised sitz baths in this case.   UA noted.  Positive nitrite many bacteria.  Will send home on Macrobid.  Sending urine off for culture to confirm diagnosis and antibiotic choice. we will have her follow-up with her GYN at [place] to discuss boric acid suppositories and long-term Flagyl.  Also advised her to try using condoms  with her partner to prevent BV recurrences.   Pt provided working phone number. Follow-up with PMD as needed. Discussed labs, MDM, plan and followup with patient. Pt agrees with plan.   Meds ordered this encounter  Medications  . metroNIDAZOLE (FLAGYL) 500 MG tablet    Sig: Take 1 tablet (500 mg total) by mouth  2 (two) times daily for 7 days.    Dispense:  14 tablet    Refill:  0  . fluconazole (DIFLUCAN) 150 MG tablet    Sig: Take 1 tablet (150 mg total) by mouth once for 1 dose. 1 tab po x 1. May repeat in 72 hours if no improvement    Dispense:  2 tablet    Refill:  1  . phenazopyridine (PYRIDIUM) 200 MG tablet    Sig: Take 1 tablet (200 mg total) by mouth 3 (three) times daily as needed for pain.    Dispense:  6 tablet    Refill:  0    *This clinic note was created using [employer]. Therefore, there may be occasional mistakes despite careful proofreading.  ?     Melynda Ripple, MD 10/22/19 1114

## 2019-10-22 NOTE — Discharge Instructions (Addendum)
You have have a yeast infection, BV, and you seem to have a UTI.  The Diflucan is for the yeast infection, Flagyl is for the BV.  I am also sending you home on Macrobid for the UTI.  I am sending your urine off for culture to make sure that you are on the correct antibiotic.  Pyridium will help with the pain with urination if it is UTI, it will not help if it is more due to vaginal irritation.  Sitz baths as we discussed.  Follow-up with your OB/GYN ASAP to discuss boric acid suppositories as long-term Flagyl.  Use condoms with your partner to help reduce the recurrence of BV.

## 2019-10-22 NOTE — ED Triage Notes (Addendum)
Patient in today c/o vaginal discharge and dysuria >1 month. Patient denies concern for STDs.

## 2019-10-25 LAB — URINE CULTURE: Culture: >=100000 — AB

## 2020-03-12 ENCOUNTER — Other Ambulatory Visit: Payer: Self-pay

## 2020-03-12 ENCOUNTER — Encounter: Payer: Self-pay | Admitting: Emergency Medicine

## 2020-03-12 ENCOUNTER — Ambulatory Visit
Admission: EM | Admit: 2020-03-12 | Discharge: 2020-03-12 | Disposition: A | Discharge status: Home / Self Care | Payer: BC Managed Care – PPO | Attending: Family Medicine | Admitting: Family Medicine

## 2020-03-12 DIAGNOSIS — B379 Candidiasis, unspecified: Secondary | ICD-10-CM | POA: Insufficient documentation

## 2020-03-12 LAB — URINALYSIS, COMPLETE (UACMP) WITH MICROSCOPIC
Bilirubin Urine: NEGATIVE
Glucose, UA: NEGATIVE mg/dL
Hgb urine dipstick: NEGATIVE
Ketones, ur: NEGATIVE mg/dL
Nitrite: NEGATIVE
Protein, ur: NEGATIVE mg/dL
RBC / HPF: NONE SEEN RBC/hpf (ref 0–5)
Specific Gravity, Urine: 1.02 (ref 1.005–1.030)
pH: 5 (ref 5.0–8.0)

## 2020-03-12 MED ORDER — FLUCONAZOLE 150 MG PO TABS: 150.0000 mg | ORAL_TABLET | Freq: Once | ORAL | 0 refills | Status: AC

## 2020-03-12 NOTE — ED Provider Notes (Signed)
[facility]    CSN: [id number] Arrival date & time: 03/12/20  1735  History   Chief Complaint Chief Complaint  Patient presents with  . Dysuria   HPI  34 year old female presents with dysuria.  1 month history of dysuria and cloudy urine.  Patient reports that she has had some low back pain and some lower abdominal cramping as well.  Last menstrual cycle was earlier this month.  Patient reports that her symptoms seem to be worsening.  She is concerned that she has a UTI.  No fever.  No nausea or vomiting.  No relieving factors.  No other associated symptoms.  No other complaints.  Past Medical History:  Diagnosis Date  . At high risk for breast cancer 05/2016   IBIS = 20.6% PER mYRIAD; RECALCULATE AGE 6  . Family history of colon cancer 06/30/2016   mom dx'd at 9; rec scr colonoscopies starting at age 73  . Genetic testing of female 05/2016   NEGATIVE  . History of Papanicolaou smear of cervix 10/06/14; 05/06/16   NEG, CT/GC NEG; NEG  . MS (multiple sclerosis) ([place])   . Multiple sclerosis [facility])     Patient Active Problem List   Diagnosis Date Noted  . Change in bowel habits 09/19/2014  . FH: colon cancer 09/19/2014  . Occult blood in stools 09/19/2014  . Constipation 08/03/2014  . Multiple sclerosis, relapsing-remitting ([place]) 08/03/2014    Past Surgical History:  Procedure Laterality Date  . FOOT SURGERY Right    bunion removal  . WISDOM TOOTH EXTRACTION  2005    OB History    Gravida  0   Para  0   Term  0   Preterm  0   AB  0   Living  0     SAB  0   TAB  0   Ectopic  0   Multiple  0   Live Births  0            Home Medications    Prior to Admission medications   Medication Sig Start Date End Date Taking? Authorizing Provider  nortriptyline (PAMELOR) 25 MG capsule Take 1 capsule by mouth at bedtime. 05/12/18  Yes [provider]  fluconazole (DIFLUCAN) 150 MG tablet Take 1 tablet (150 mg total) by mouth once for  1 dose. Repeat dose in 72 hours. 03/12/20 03/12/20  Coral Spikes, DO  medroxyPROGESTERone (DEPO-PROVERA) 150 MG/ML injection Inject 1 mL (150 mg total) into the muscle every 3 (three) months. 06/29/18 06/28/19  Rexene Agent, CNM    Family History Family History  Problem Relation Age of Onset  . Cancer Mother 84       COLON  . Other Mother        cervical dysplasia  . Cancer Father 73       pancreatic, lung  . Lung disease Father   . Breast cancer Paternal Aunt 39    Social History Social History   Tobacco Use  . Smoking status: Never Smoker  . Smokeless tobacco: Never Used  Substance Use Topics  . Alcohol use: Yes    Comment: LIGHT  . Drug use: No     Allergies   Patient has no known allergies.   Review of Systems Review of Systems  Gastrointestinal:       Abdominal cramping.  Genitourinary: Positive for dysuria.       Cloudy urine.  Musculoskeletal: Positive for back pain.  Physical Exam Triage Vital Signs ED Triage Vitals  Enc Vitals Group     BP 03/12/20 1820 120/82     Pulse Rate 03/12/20 1820 90     Resp 03/12/20 1820 18     Temp 03/12/20 1820 98.1 F (36.7 C)     Temp Source 03/12/20 1820 Oral     SpO2 03/12/20 1820 100 %     Weight 03/12/20 1818 175 lb (79.4 kg)     Height 03/12/20 1818 '5\' 5"'  (1.651 m)     Head Circumference --      Peak Flow --      Pain Score 03/12/20 1817 2     Pain Loc --      Pain Edu? --      Excl. in [place]? --    Updated Vital Signs BP 120/82 (BP Location: Right Arm)   Pulse 90   Temp 98.1 F (36.7 C) (Oral)   Resp 18   Ht '5\' 5"'  (1.651 m)   Wt 79.4 kg   LMP 02/21/2020 (Approximate)   SpO2 100%   BMI 29.12 kg/m   Visual Acuity Right Eye Distance:   Left Eye Distance:   Bilateral Distance:    Right Eye Near:   Left Eye Near:    Bilateral Near:     Physical Exam Vitals and nursing note reviewed.  Constitutional:      General: She is not in acute distress.    Appearance: Normal appearance. She is not  ill-appearing.  HENT:     Head: Normocephalic and atraumatic.  Eyes:     General:        Right eye: No discharge.        Left eye: No discharge.     Conjunctiva/sclera: Conjunctivae normal.  Cardiovascular:     Rate and Rhythm: Normal rate and regular rhythm.     Heart sounds: No murmur.  Pulmonary:     Effort: Pulmonary effort is normal.     Breath sounds: Normal breath sounds. No wheezing, rhonchi or rales.  Abdominal:     General: There is no distension.     Palpations: Abdomen is soft.     Tenderness: There is no abdominal tenderness.  Neurological:     Mental Status: She is alert.  Psychiatric:        Mood and Affect: Mood normal.        Behavior: Behavior normal.    UC Treatments / Results  Labs (all labs ordered are listed, but only abnormal results are displayed) Labs Reviewed  URINALYSIS, COMPLETE (UACMP) WITH MICROSCOPIC - Abnormal; Notable for the following components:      Result Value   APPearance HAZY (*)    Leukocytes,Ua MODERATE (*)    Bacteria, UA FEW (*)    All other components within normal limits  URINE CULTURE    EKG   Radiology No results found.  Procedures Procedures (including critical care time)  Medications Ordered in UC Medications - No data to display  Initial Impression / Assessment and Plan / UC Course  I have reviewed the triage vital signs and the nursing notes.  Pertinent labs & imaging results that were available during my care of the patient were reviewed by me and considered in my medical decision making (see chart for details).    34 year old female presents with dysuria and cloudy urine.  11-20 WBCs on microscopy.  Budding yeast present.  Given the duration of her symptoms, I do not feel that  she has UTI.  Sending culture.  Placing on Diflucan.  Supportive care.  Final Clinical Impressions(s) / UC Diagnoses   Final diagnoses:  Yeast infection     Discharge Instructions     Medication as prescribed.  Take  care  Dr. Lacinda Axon    ED Prescriptions    Medication Sig Dispense Auth. Provider   fluconazole (DIFLUCAN) 150 MG tablet Take 1 tablet (150 mg total) by mouth once for 1 dose. Repeat dose in 72 hours. 2 tablet Coral Spikes, DO     PDMP not reviewed this encounter.   Coral Spikes, [place] 03/12/20 1921

## 2020-03-12 NOTE — ED Triage Notes (Signed)
Pt c/o dysuria, lower back pain and lower abdominal cramping. Started about a month ago but has gotten worse today.

## 2020-03-12 NOTE — Discharge Instructions (Signed)
Medication as prescribed.  Take care  Dr. Diamonte Stavely  

## 2020-03-13 LAB — URINE CULTURE

## 2020-03-22 ENCOUNTER — Encounter: Payer: Self-pay | Admitting: Obstetrics and Gynecology

## 2020-03-22 ENCOUNTER — Other Ambulatory Visit: Payer: Self-pay

## 2020-03-22 ENCOUNTER — Other Ambulatory Visit (HOSPITAL_COMMUNITY)
Admission: RE | Admit: 2020-03-22 | Discharge: 2020-03-22 | Disposition: A | Discharge status: Home / Self Care | Payer: BC Managed Care – PPO | Source: Ambulatory Visit | Attending: Obstetrics and Gynecology | Admitting: Obstetrics and Gynecology

## 2020-03-22 ENCOUNTER — Ambulatory Visit (INDEPENDENT_AMBULATORY_CARE_PROVIDER_SITE_OTHER): Payer: BC Managed Care – PPO | Admitting: Obstetrics and Gynecology

## 2020-03-22 VITALS — BP 133/83 | HR 85 | Ht 65.0 in | Wt 181.0 lb

## 2020-03-22 DIAGNOSIS — Z124 Encounter for screening for malignant neoplasm of cervix: Secondary | ICD-10-CM

## 2020-03-22 DIAGNOSIS — Z1339 Encounter for screening examination for other mental health and behavioral disorders: Secondary | ICD-10-CM | POA: Diagnosis not present

## 2020-03-22 DIAGNOSIS — Z1331 Encounter for screening for depression: Secondary | ICD-10-CM

## 2020-03-22 DIAGNOSIS — Z01419 Encounter for gynecological examination (general) (routine) without abnormal findings: Secondary | ICD-10-CM

## 2020-03-22 DIAGNOSIS — Z113 Encounter for screening for infections with a predominantly sexual mode of transmission: Secondary | ICD-10-CM
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
## 2020-03-23 ENCOUNTER — Encounter: Payer: Self-pay | Admitting: Obstetrics and Gynecology

## 2020-03-23 MED ORDER — METRONIDAZOLE 0.75 % VA GEL: 1.0000 | VAGINAL | 1 refills | Status: AC

## 2020-03-27 LAB — CYTOLOGY - PAP
Chlamydia: NEGATIVE
Comment: NEGATIVE
Comment: NEGATIVE
Comment: NEGATIVE
Comment: NORMAL
Diagnosis: NEGATIVE
High risk HPV: NEGATIVE
Neisseria Gonorrhea: NEGATIVE
Trichomonas: NEGATIVE

## 2020-04-05 ENCOUNTER — Encounter: Payer: Self-pay | Admitting: Obstetrics and Gynecology

## 2020-07-08 ENCOUNTER — Ambulatory Visit
Admission: EM | Admit: 2020-07-08 | Discharge: 2020-07-08 | Disposition: A | Discharge status: Home / Self Care | Payer: BC Managed Care – PPO

## 2020-07-08 ENCOUNTER — Encounter: Payer: Self-pay | Admitting: Emergency Medicine

## 2020-07-08 ENCOUNTER — Other Ambulatory Visit: Payer: Self-pay

## 2020-07-08 DIAGNOSIS — S161XXA Strain of muscle, fascia and tendon at neck level, initial encounter: Secondary | ICD-10-CM | POA: Diagnosis not present

## 2020-07-08 DIAGNOSIS — M62838 Other muscle spasm: Secondary | ICD-10-CM

## 2020-07-08 MED ORDER — DICLOFENAC SODIUM 75 MG PO TBEC: 75.0000 mg | DELAYED_RELEASE_TABLET | Freq: Two times a day (BID) | ORAL | 0 refills | Status: AC

## 2020-07-08 MED ORDER — TIZANIDINE HCL 4 MG PO TABS: 4.0000 mg | ORAL_TABLET | Freq: Three times a day (TID) | ORAL | 0 refills | Status: AC | PRN

## 2020-07-08 NOTE — Discharge Instructions (Signed)
NECK PAIN: Stressed avoiding painful activities. This can exacerbate your symptoms and make them worse.  May apply heat to the areas of pain for some relief. Use medications as directed. Be aware of which medications make you drowsy and do not drive or operate any kind of heavy machinery while using the medication (ie pain medications or muscle relaxers). F/U with PCP for reexamination or return sooner if condition worsens or does not begin to improve over the next few days.   NECK PAIN RED FLAGS: If symptoms get worse than they are right now, you should come back sooner for re-evaluation. If you have increased numbness/ tingling or notice that the numbness/tingling is affecting the legs or saddle region, go to ER. If you ever lose continence go to ER.     
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
## 2020-08-07 ENCOUNTER — Ambulatory Visit (INDEPENDENT_AMBULATORY_CARE_PROVIDER_SITE_OTHER): Payer: BC Managed Care – PPO | Admitting: Family Medicine

## 2020-08-07 ENCOUNTER — Other Ambulatory Visit: Payer: Self-pay

## 2020-08-07 ENCOUNTER — Encounter: Payer: Self-pay | Admitting: Family Medicine

## 2020-08-07 VITALS — BP 130/80 | HR 80 | Ht 65.0 in | Wt 181.0 lb

## 2020-08-07 DIAGNOSIS — F329 Major depressive disorder, single episode, unspecified: Secondary | ICD-10-CM

## 2020-08-07 DIAGNOSIS — F419 Anxiety disorder, unspecified: Secondary | ICD-10-CM

## 2020-08-07 MED ORDER — BUPROPION HCL ER (XL) 150 MG PO TB24: 150.0000 mg | ORAL_TABLET | Freq: Every day | ORAL | 2 refills | Status: DC

## 2020-08-07 MED ORDER — BUSPIRONE HCL 7.5 MG PO TABS: 7.5000 mg | ORAL_TABLET | Freq: Two times a day (BID) | ORAL | 2 refills | Status: DC

## 2020-08-07 NOTE — Progress Notes (Signed)
Date:  08/07/2020   Name:  Brandy Krause   DOB:  1986/10/14   MRN:  [id number]   Chief Complaint: Anxiety (referral to cbc PHQ9=11 and GAD7=8)  Anxiety Presents for follow-up visit. Symptoms include excessive worry and nervous/anxious behavior. Patient reports no chest pain, compulsions, confusion, decreased concentration, depressed mood, dizziness, dry mouth, feeling of choking, hyperventilation, impotence, insomnia, irritability, malaise, muscle tension, nausea, obsessions, palpitations, panic, restlessness, shortness of breath or suicidal ideas. Symptoms occur most days.   Compliance with medications is 51-75%.  Depression        This is a chronic problem.  The current episode started more than 1 year ago.   The onset quality is gradual.   The problem occurs daily.  The problem has been waxing and waning since onset.  Associated symptoms include fatigue, irritable, decreased interest and sad.  Associated symptoms include no decreased concentration, does not have insomnia, no restlessness, no myalgias, no headaches and no suicidal ideas.  Treatments tried: sertraline/welbutrin.  Past medical history includes anxiety.     No results found for: CREATININE, BUN, NA, K, CL, CO2 No results found for: CHOL, HDL, LDLCALC, LDLDIRECT, TRIG, CHOLHDL No results found for: TSH No results found for: HGBA1C No results found for: WBC, HGB, HCT, MCV, PLT Lab Results  Component Value Date   ALT 13 04/20/2018   AST 15 04/20/2018   ALKPHOS 49 04/20/2018   BILITOT 0.3 04/20/2018     Review of Systems  Constitutional: Positive for fatigue. Negative for chills, fever, irritability and unexpected weight change.  HENT: Negative for congestion, ear discharge, ear pain, rhinorrhea, sinus pressure, sneezing and sore throat.   Eyes: Negative for photophobia, pain, discharge, redness and itching.  Respiratory: Negative for cough, shortness of breath, wheezing and stridor.   Cardiovascular: Negative for  chest pain and palpitations.  Gastrointestinal: Negative for abdominal pain, blood in stool, constipation, diarrhea, nausea and vomiting.  Endocrine: Negative for cold intolerance, heat intolerance, polydipsia, polyphagia and polyuria.  Genitourinary: Negative for dysuria, flank pain, frequency, hematuria, impotence, menstrual problem, pelvic pain, urgency, vaginal bleeding and vaginal discharge.  Musculoskeletal: Negative for arthralgias, back pain and myalgias.  Skin: Negative for rash.  Allergic/Immunologic: Negative for environmental allergies and food allergies.  Neurological: Negative for dizziness, weakness, light-headedness, numbness and headaches.  Hematological: Negative for adenopathy. Does not bruise/bleed easily.  Psychiatric/Behavioral: Positive for depression. Negative for confusion, decreased concentration, dysphoric mood and suicidal ideas. The patient is nervous/anxious. The patient does not have insomnia.     Patient Active Problem List   Diagnosis Date Noted  . Change in bowel habits 09/19/2014  . FH: colon cancer 09/19/2014  . Occult blood in stools 09/19/2014  . Constipation 08/03/2014  . Multiple sclerosis, relapsing-remitting ([place]) 08/03/2014    No Known Allergies  Past Surgical History:  Procedure Laterality Date  . FOOT SURGERY Right    bunion removal  . WISDOM TOOTH EXTRACTION  2005    Social History   Tobacco Use  . Smoking status: Never Smoker  . Smokeless tobacco: Never Used  Vaping Use  . Vaping Use: Never used  Substance Use Topics  . Alcohol use: Yes    Comment: LIGHT  . Drug use: No     Medication list has been reviewed and updated.  Current Meds  Medication Sig  . naproxen (NAPROSYN) 500 MG tablet Take 500 mg by mouth 2 (two) times daily.  . nortriptyline (PAMELOR) 25 MG capsule Take 1 capsule by mouth  at bedtime.    PHQ 2/9 Scores 08/07/2020 03/22/2020 04/07/2018 07/16/2017  PHQ - 2 Score 2 1 0 4  PHQ- 9 Score 11 2 1 17   Exception  Documentation - - - Patient refusal    GAD 7 : Generalized Anxiety Score 08/07/2020  Nervous, Anxious, on Edge 3  Control/stop worrying 1  Worry too much - different things 1  Trouble relaxing 1  Restless 1  Easily annoyed or irritable 1  Afraid - awful might happen 0  Total GAD 7 Score 8  Anxiety Difficulty Not difficult at all    BP Readings from Last 3 Encounters:  08/07/20 130/80  07/08/20 130/89  03/22/20 133/83    Physical Exam Vitals and nursing note reviewed.  Constitutional:      General: She is irritable.     Appearance: She is well-developed.  HENT:     Head: Normocephalic.     Right Ear: External ear normal.     Left Ear: External ear normal.  Eyes:     General: Lids are everted, no foreign bodies appreciated. No scleral icterus.       Left eye: No foreign body or hordeolum.     Conjunctiva/sclera: Conjunctivae normal.     Right eye: Right conjunctiva is not injected.     Left eye: Left conjunctiva is not injected.     Pupils: Pupils are equal, round, and reactive to light.  Neck:     Thyroid: No thyromegaly.     Vascular: No JVD.     Trachea: No tracheal deviation.  Cardiovascular:     Rate and Rhythm: Normal rate and regular rhythm.     Heart sounds: Normal heart sounds. No murmur heard.  No friction rub. No gallop.   Pulmonary:     Effort: Pulmonary effort is normal. No respiratory distress.     Breath sounds: Normal breath sounds. No wheezing or rales.  Abdominal:     General: Bowel sounds are normal.     Palpations: Abdomen is soft. There is no mass.     Tenderness: There is no abdominal tenderness. There is no guarding or rebound.  Musculoskeletal:        General: No tenderness. Normal range of motion.     Cervical back: Normal range of motion and neck supple.  Lymphadenopathy:     Cervical: No cervical adenopathy.  Skin:    General: Skin is warm.     Findings: No rash.  Neurological:     Mental Status: She is alert and oriented to person,  place, and time.     Cranial Nerves: No cranial nerve deficit.     Deep Tendon Reflexes: Reflexes normal.  Psychiatric:        Mood and Affect: Mood is not anxious or depressed.     Wt Readings from Last 3 Encounters:  08/07/20 181 lb (82.1 kg)  07/08/20 181 lb (82.1 kg)  03/22/20 181 lb (82.1 kg)    BP 130/80   Pulse 80   Ht 5\' 5"  (1.651 m)   Wt 181 lb (82.1 kg)   BMI 30.12 kg/m   Assessment and Plan: 1. Reactive depression Chronic.  Uncontrolled.  Stable.  Patient's PHQ was increased prior to previous.  Will resume Wellbutrin XL 150 mg once a day. - buPROPion (WELLBUTRIN XL) 150 MG 24 hr tablet; Take 1 tablet (150 mg total) by mouth daily.  Dispense: 30 tablet; Refill: 2  2. Anxiety .  Controlled.  Stable.  Patient's gad  score is elevated prior to previous recorded score.  Will resume buspirone 7.5 mg twice a day. - busPIRone (BUSPAR) 7.5 MG tablet; Take 1 tablet (7.5 mg total) by mouth 2 (two) times daily.  Dispense: 60 tablet; Refill: 2

## 2020-08-13 ENCOUNTER — Ambulatory Visit
Admission: EM | Admit: 2020-08-13 | Discharge: 2020-08-13 | Disposition: A | Discharge status: Home / Self Care | Payer: BC Managed Care – PPO | Attending: Physician Assistant | Admitting: Physician Assistant

## 2020-08-13 ENCOUNTER — Other Ambulatory Visit: Payer: Self-pay

## 2020-08-13 ENCOUNTER — Encounter: Payer: Self-pay | Admitting: Emergency Medicine

## 2020-08-13 DIAGNOSIS — K219 Gastro-esophageal reflux disease without esophagitis: Secondary | ICD-10-CM | POA: Insufficient documentation

## 2020-08-13 DIAGNOSIS — R1013 Epigastric pain: Secondary | ICD-10-CM

## 2020-08-13 LAB — CBC WITH DIFFERENTIAL/PLATELET
Abs Immature Granulocytes: 0.02 10*3/uL (ref 0.00–0.07)
Basophils Absolute: 0 10*3/uL (ref 0.0–0.1)
Basophils Relative: 0 %
Eosinophils Absolute: 0.1 10*3/uL (ref 0.0–0.5)
Eosinophils Relative: 1 %
HCT: 40.8 % (ref 36.0–46.0)
Hemoglobin: 13.7 g/dL (ref 12.0–15.0)
Immature Granulocytes: 0 %
Lymphocytes Relative: 30 %
Lymphs Abs: 2.5 10*3/uL (ref 0.7–4.0)
MCH: 29.4 pg (ref 26.0–34.0)
MCHC: 33.6 g/dL (ref 30.0–36.0)
MCV: 87.6 fL (ref 80.0–100.0)
Monocytes Absolute: 0.7 10*3/uL (ref 0.1–1.0)
Monocytes Relative: 8 %
Neutro Abs: 5.1 10*3/uL (ref 1.7–7.7)
Neutrophils Relative %: 61 %
Platelets: 223 10*3/uL (ref 150–400)
RBC: 4.66 MIL/uL (ref 3.87–5.11)
RDW: 13.2 % (ref 11.5–15.5)
WBC: 8.4 10*3/uL (ref 4.0–10.5)
nRBC: 0 % (ref 0.0–0.2)

## 2020-08-13 LAB — COMPREHENSIVE METABOLIC PANEL
ALT: 16 U/L (ref 0–44)
AST: 18 U/L (ref 15–41)
Albumin: 4.3 g/dL (ref 3.5–5.0)
Alkaline Phosphatase: 37 U/L — ABNORMAL LOW (ref 38–126)
Anion gap: 10 (ref 5–15)
BUN: 24 mg/dL — ABNORMAL HIGH (ref 6–20)
CO2: 25 mmol/L (ref 22–32)
Calcium: 9 mg/dL (ref 8.9–10.3)
Chloride: 100 mmol/L (ref 98–111)
Creatinine, Ser: 0.78 mg/dL (ref 0.44–1.00)
GFR calc Af Amer: >60 mL/min (ref >60)
GFR calc non Af Amer: >60 mL/min (ref >60)
Glucose, Bld: 84 mg/dL (ref 70–99)
Potassium: 3.2 mmol/L — ABNORMAL LOW (ref 3.5–5.1)
Sodium: 135 mmol/L (ref 135–145)
Total Bilirubin: 0.5 mg/dL (ref 0.3–1.2)
Total Protein: 8.2 g/dL — ABNORMAL HIGH (ref 6.5–8.1)

## 2020-08-13 LAB — LIPASE, BLOOD: Lipase: 35 U/L (ref 11–51)

## 2020-08-13 MED ORDER — OMEPRAZOLE 20 MG PO CPDR: 20.0000 mg | DELAYED_RELEASE_CAPSULE | Freq: Every day | ORAL | 0 refills | Status: DC

## 2020-08-13 MED ORDER — SUCRALFATE 1 GM/10ML PO SUSP: 1.0000 g | Freq: Three times a day (TID) | ORAL | 0 refills | Status: DC

## 2020-08-13 NOTE — ED Triage Notes (Signed)
Pt c/o epigastric abdominal pain. Started about a month. She states she has a "bile" taste in her mouth and her tongue has a yellow film. She has not tried anything to relieve the pain. She states the pain is worse when she lays down. Denies nausea, vomiting or fever.

## 2020-08-13 NOTE — ED Provider Notes (Signed)
[facility]    CSN: [id number] Arrival date & time: 08/13/20  1726      History   Chief Complaint Chief Complaint  Patient presents with   Abdominal Pain    HPI Brandy Krause is a 34 y.o. female presenting for epigastric abdominal pain for the past 2 months.  Says she really only has the pain when she lays down, especially at night.  Says she has not really noticed any difference with eating.  She denies any radiation to the back or any other part of the abdomen.  Denies acetic fever, fatigue, chills, sweats, chest pain, cough, nausea, vomiting, diarrhea, dysuria, urinary frequency, urinary urgency, hematuria, vaginal discharge, or vaginal itching.  She says she has not tried anything to relieve the pain.  Last menstrual period 7 days ago.  Denies any concern for STIs or pregnancy.  HPI  Past Medical History:  Diagnosis Date   At high risk for breast cancer 05/2016   IBIS = 20.6% PER mYRIAD; RECALCULATE AGE 58   BRCA negative 05/2016   MyRisk neg   Family history of colon cancer 06/30/2016   mom dx'd at 76; rec scr colonoscopies starting at age 37   History of Papanicolaou smear of cervix 10/06/14; 05/06/16   NEG, CT/GC NEG; NEG   MS (multiple sclerosis) ([place])    Multiple sclerosis ([place])     Patient Active Problem List   Diagnosis Date Noted   Change in bowel habits 09/19/2014   FH: colon cancer 09/19/2014   Occult blood in stools 09/19/2014   Constipation 08/03/2014   Multiple sclerosis, relapsing-remitting ([place]) 08/03/2014    Past Surgical History:  Procedure Laterality Date   FOOT SURGERY Right    bunion removal   WISDOM TOOTH EXTRACTION  2005    OB History    Gravida  0   Para  0   Term  0   Preterm  0   AB  0   Living  0     SAB  0   TAB  0   Ectopic  0   Multiple  0   Live Births  0            Home Medications    Prior to Admission medications   Medication Sig Start Date End Date Taking? Authorizing  Provider  buPROPion (WELLBUTRIN XL) 150 MG 24 hr tablet Take 1 tablet (150 mg total) by mouth daily. 08/07/20  Yes Juline Patch, MD  busPIRone (BUSPAR) 7.5 MG tablet Take 1 tablet (7.5 mg total) by mouth 2 (two) times daily. 08/07/20  Yes Juline Patch, MD  naproxen (NAPROSYN) 500 MG tablet Take 500 mg by mouth 2 (two) times daily. 06/29/20  Yes [provider]  nortriptyline (PAMELOR) 25 MG capsule Take 1 capsule by mouth at bedtime. 05/12/18  Yes [provider]  omeprazole (PRILOSEC) 20 MG capsule Take 1 capsule (20 mg total) by mouth daily. 08/13/20 09/12/20  Laurene Footman B, PA-C  sucralfate (CARAFATE) 1 GM/10ML suspension Take 10 mLs (1 g total) by mouth 4 (four) times daily -  with meals and at bedtime for 10 days. 08/13/20 08/23/20  Danton Clap, PA-C  medroxyPROGESTERone (DEPO-PROVERA) 150 MG/ML injection Inject 1 mL (150 mg total) into the muscle every 3 (three) months. 06/29/18 06/28/19  Rexene Agent, CNM    Family History Family History  Problem Relation Age of Onset   Cancer Mother 80       COLON  Other Mother        cervical dysplasia   Cancer Father 68       pancreatic, lung   Lung disease Father    Breast cancer Paternal Aunt 59    Social History Social History   Tobacco Use   Smoking status: Never Smoker   Smokeless tobacco: Never Used  Scientific laboratory technician Use: Never used  Substance Use Topics   Alcohol use: Yes    Comment: LIGHT   Drug use: No     Allergies   Patient has no known allergies.   Review of Systems Review of Systems  Constitutional: Negative for chills, diaphoresis, fatigue and fever.  HENT: Negative for congestion, ear pain, rhinorrhea, sinus pressure, sinus pain and sore throat.   Respiratory: Negative for cough and shortness of breath.   Gastrointestinal: Positive for abdominal pain. Negative for nausea and vomiting.  Musculoskeletal: Negative for arthralgias and myalgias.  Skin: Negative for rash.   Neurological: Negative for weakness and headaches.  Hematological: Negative for adenopathy.     Physical Exam Triage Vital Signs ED Triage Vitals  Enc Vitals Group     BP 08/13/20 1859 (!) 145/93     Pulse Rate 08/13/20 1859 87     Resp 08/13/20 1859 18     Temp 08/13/20 1859 98.2 F (36.8 C)     Temp Source 08/13/20 1859 Oral     SpO2 08/13/20 1859 100 %     Weight 08/13/20 1856 181 lb (82.1 kg)     Height 08/13/20 1856 _0  (1.651 m)     Head Circumference --      Peak Flow --      Pain Score 08/13/20 1856 0     Pain Loc --      Pain Edu? --      Excl. in [place]? --    No data found.  Updated Vital Signs BP (!) 145/93 (BP Location: Left Arm)    Pulse 87    Temp 98.2 F (36.8 C) (Oral)    Resp 18    Ht _1  (1.651 m)    Wt 181 lb (82.1 kg)    LMP 08/06/2020 (Approximate)    SpO2 100%    BMI 30.12 kg/m      Physical Exam Vitals and nursing note reviewed.  Constitutional:      General: She is not in acute distress.    Appearance: Normal appearance. She is not ill-appearing or toxic-appearing.  HENT:     Head: Normocephalic and atraumatic.     Nose: Nose normal.     Mouth/Throat:     Mouth: Mucous membranes are dry.     Pharynx: Oropharynx is clear. No posterior oropharyngeal erythema.  Eyes:     General: No scleral icterus.       Right eye: No discharge.        Left eye: No discharge.     Conjunctiva/sclera: Conjunctivae normal.  Cardiovascular:     Rate and Rhythm: Normal rate and regular rhythm.     Heart sounds: Normal heart sounds.  Pulmonary:     Effort: Pulmonary effort is normal. No respiratory distress.     Breath sounds: Normal breath sounds.  Abdominal:     General: Bowel sounds are normal.     Palpations: Abdomen is soft.     Tenderness: There is abdominal tenderness in the epigastric area. There is no right CVA tenderness, left CVA tenderness or guarding.  Musculoskeletal:  Cervical back: Neck supple.  Skin:    General: Skin is dry.   Neurological:     General: No focal deficit present.     Mental Status: She is alert. Mental status is at baseline.     Motor: No weakness.     Gait: Gait normal.  Psychiatric:        Mood and Affect: Mood normal.        Behavior: Behavior normal.        Thought Content: Thought content normal.      UC Treatments / Results  Labs (all labs ordered are listed, but only abnormal results are displayed) Labs Reviewed  COMPREHENSIVE METABOLIC PANEL - Abnormal; Notable for the following components:      Result Value   Potassium 3.2 (*)    BUN 24 (*)    Total Protein 8.2 (*)    Alkaline Phosphatase 37 (*)    All other components within normal limits  CBC WITH DIFFERENTIAL/PLATELET  LIPASE, BLOOD    EKG   Radiology No results found.  Procedures Procedures (including critical care time)  Medications Ordered in UC Medications - No data to display  Initial Impression / Assessment and Plan / UC Course  I have reviewed the triage vital signs and the nursing notes.  Pertinent labs & imaging results that were available during my care of the patient were reviewed by me and considered in my medical decision making (see chart for details).    34 year old female presenting for 1 to 81-monthhistory of epigastric pain.  On exam she has mild to moderate tenderness at the epigastric area without any guarding or rebound.  All vital signs are stable.  Blood pressure slightly elevated at 145/93.  Patient unable to leave urine specimen on 2 separate attempts.  CBC unremarkable.  Potassium slightly low at 3.2.  BUN elevated at 24.  Says she cannot leave a urine sample and BUN is still elevated advised patient she likely is dehydrated and should increase her fluid intake.  Suspect GERD as the cause for her symptoms.  Prescribed omeprazole and Carafate.  Advised her to eat smaller meals and not 3 hours before bedtime.  Advised that she is not feeling better with this treatment then she should  follow-up with GI specialist for endoscopy to assess if she has any other esophageal issues or stomach ulcers.  If she runs any fevers or has any weakness, vomiting, dark stools or bloody stools she should go to the emergency room.  Patient understanding and agreeable.   Final Clinical Impressions(s) / UC Diagnoses   Final diagnoses:  Abdominal pain, epigastric  Gastroesophageal reflux disease without esophagitis     Discharge Instructions     ABDOMINAL PAIN: You may take Tylenol for pain relief. Use medications as directed including antiemetics and antidiarrheal medications if suggested or prescribed. You should increase fluids and electrolytes as well as rest over these next several days. If you have any questions or concerns, or if your symptoms are not improving or if especially if they acutely worsen, please call or stop back to the clinic immediately and we will be happy to help you or go to the ER   ABDOMINAL PAIN RED FLAGS: Seek immediate further care if: symptoms remain the same or worsen over the next 3-7 days, you are unable to keep fluids down, you see blood or mucus in your stool, you vomit black or dark red material, you have a fever of 101.F or higher, you  have localized and/or persistent abdominal pain      ED Prescriptions    Medication Sig Dispense Auth. Provider   omeprazole (PRILOSEC) 20 MG capsule Take 1 capsule (20 mg total) by mouth daily. 30 capsule Laurene Footman B, PA-C   sucralfate (CARAFATE) 1 GM/10ML suspension Take 10 mLs (1 g total) by mouth 4 (four) times daily -  with meals and at bedtime for 10 days. 400 mL Danton Clap, PA-C     PDMP not reviewed this encounter.   Danton Clap, PA-C 08/13/20 2032

## 2020-08-13 NOTE — Discharge Instructions (Addendum)
ABDOMINAL PAIN: You may take Tylenol for pain relief. Use medications as directed including antiemetics and antidiarrheal medications if suggested or prescribed. You should increase fluids and electrolytes as well as rest over these next several days. If you have any questions or concerns, or if your symptoms are not improving or if especially if they acutely worsen, please call or stop back to the clinic immediately and we will be happy to help you or go to the ER   ABDOMINAL PAIN RED FLAGS: Seek immediate further care if: symptoms remain the same or worsen over the next 3-7 days, you are unable to keep fluids down, you see blood or mucus in your stool, you vomit black or dark red material, you have a fever of 101.F or higher, you have localized and/or persistent abdominal pain   

## 2020-09-19 ENCOUNTER — Encounter: Payer: Self-pay | Admitting: Family Medicine

## 2020-09-19 ENCOUNTER — Ambulatory Visit (INDEPENDENT_AMBULATORY_CARE_PROVIDER_SITE_OTHER): Payer: BC Managed Care – PPO | Admitting: Family Medicine

## 2020-09-19 ENCOUNTER — Other Ambulatory Visit: Payer: Self-pay

## 2020-09-19 VITALS — BP 130/80 | HR 80 | Ht 65.0 in | Wt 181.0 lb

## 2020-09-19 DIAGNOSIS — K5901 Slow transit constipation: Secondary | ICD-10-CM | POA: Diagnosis not present

## 2020-09-19 DIAGNOSIS — K921 Melena: Secondary | ICD-10-CM | POA: Diagnosis not present

## 2020-09-19 DIAGNOSIS — F419 Anxiety disorder, unspecified: Secondary | ICD-10-CM

## 2020-09-19 DIAGNOSIS — F329 Major depressive disorder, single episode, unspecified: Secondary | ICD-10-CM | POA: Diagnosis not present

## 2020-09-19 MED ORDER — BUPROPION HCL ER (XL) 150 MG PO TB24: 150.0000 mg | ORAL_TABLET | Freq: Every day | ORAL | 1 refills | Status: DC

## 2020-09-19 MED ORDER — BUSPIRONE HCL 7.5 MG PO TABS: 7.5000 mg | ORAL_TABLET | Freq: Two times a day (BID) | ORAL | 1 refills | Status: DC

## 2020-09-19 MED ORDER — DOCUSATE SODIUM 50 MG PO CAPS: 50.0000 mg | ORAL_CAPSULE | Freq: Every day | ORAL | 1 refills | Status: DC

## 2020-09-19 NOTE — Progress Notes (Signed)
Date:  09/19/2020   Name:  Brandy Krause   DOB:  02-09-1986   MRN:  [id number]   Chief Complaint: Depression (follow up- down from 11 and 8 to 0 and 0)  Depression        This is a chronic problem.  The current episode started more than 1 year ago.   The onset quality is gradual.   The problem occurs intermittently.  The problem has been gradually improving since onset.  Associated symptoms include no decreased concentration, no fatigue, no helplessness, no hopelessness, does not have insomnia, not irritable, no restlessness, no decreased interest, no appetite change, no body aches, no myalgias, no headaches, no indigestion, not sad and no suicidal ideas.  Past treatments include SSRIs - Selective serotonin reuptake inhibitors.  Compliance with treatment is good.  Previous treatment provided significant relief.  Past medical history includes anxiety.   Anxiety Presents for follow-up visit. Patient reports no chest pain, compulsions, confusion, decreased concentration, depressed mood, dizziness, dry mouth, excessive worry, feeling of choking, hyperventilation, insomnia, irritability, malaise, muscle tension, nausea, nervous/anxious behavior, obsessions, palpitations, panic, restlessness, shortness of breath or suicidal ideas.    GI Problem The primary symptoms include abdominal pain and hematochezia. Primary symptoms do not include fever, weight loss, fatigue, nausea, vomiting, diarrhea, melena, hematemesis, jaundice, dysuria, myalgias, arthralgias or rash.  The illness is also significant for bloating, constipation and back pain. The illness does not include chills, anorexia, dysphagia, odynophagia, tenesmus or itching. Significant associated medical issues include hemorrhoids. Associated medical issues do not include alcohol abuse, gastric bypass or bowel resection. Associated medical issues comments: history internal hemorrhoids.    Lab Results  Component Value Date   CREATININE 0.78  08/13/2020   BUN 24 (H) 08/13/2020   NA 135 08/13/2020   K 3.2 (L) 08/13/2020   CL 100 08/13/2020   CO2 25 08/13/2020   No results found for: CHOL, HDL, LDLCALC, LDLDIRECT, TRIG, CHOLHDL No results found for: TSH No results found for: HGBA1C Lab Results  Component Value Date   WBC 8.4 08/13/2020   HGB 13.7 08/13/2020   HCT 40.8 08/13/2020   MCV 87.6 08/13/2020   PLT 223 08/13/2020   Lab Results  Component Value Date   ALT 16 08/13/2020   AST 18 08/13/2020   ALKPHOS 37 (L) 08/13/2020   BILITOT 0.5 08/13/2020     Review of Systems  Constitutional: Negative.  Negative for appetite change, chills, fatigue, fever, irritability, unexpected weight change and weight loss.  HENT: Negative for congestion, ear discharge, ear pain, rhinorrhea, sinus pressure, sneezing and sore throat.   Eyes: Negative for photophobia, pain, discharge, redness and itching.  Respiratory: Negative for cough, shortness of breath, wheezing and stridor.   Cardiovascular: Negative for chest pain and palpitations.  Gastrointestinal: Positive for abdominal pain, bloating, constipation and hematochezia. Negative for anorexia, blood in stool, diarrhea, dysphagia, hematemesis, jaundice, melena, nausea and vomiting.  Endocrine: Negative for cold intolerance, heat intolerance, polydipsia, polyphagia and polyuria.  Genitourinary: Negative for dysuria, flank pain, frequency, hematuria, menstrual problem, pelvic pain, urgency, vaginal bleeding and vaginal discharge.  Musculoskeletal: Positive for back pain. Negative for arthralgias and myalgias.  Skin: Negative for itching and rash.  Allergic/Immunologic: Negative for environmental allergies and food allergies.  Neurological: Negative for dizziness, weakness, light-headedness, numbness and headaches.  Hematological: Negative for adenopathy. Does not bruise/bleed easily.  Psychiatric/Behavioral: Positive for depression. Negative for confusion, decreased concentration,  dysphoric mood and suicidal ideas. The patient is not nervous/anxious  and does not have insomnia.     Patient Active Problem List   Diagnosis Date Noted  . Change in bowel habits 09/19/2014  . FH: colon cancer 09/19/2014  . Occult blood in stools 09/19/2014  . Constipation 08/03/2014  . Multiple sclerosis, relapsing-remitting ([place]) 08/03/2014    No Known Allergies  Past Surgical History:  Procedure Laterality Date  . FOOT SURGERY Right    bunion removal  . WISDOM TOOTH EXTRACTION  2005    Social History   Tobacco Use  . Smoking status: Never Smoker  . Smokeless tobacco: Never Used  Vaping Use  . Vaping Use: Never used  Substance Use Topics  . Alcohol use: Yes    Comment: LIGHT  . Drug use: No     Medication list has been reviewed and updated.  Current Meds  Medication Sig  . buPROPion (WELLBUTRIN XL) 150 MG 24 hr tablet Take 1 tablet (150 mg total) by mouth daily.  . busPIRone (BUSPAR) 7.5 MG tablet Take 1 tablet (7.5 mg total) by mouth 2 (two) times daily.  . naproxen (NAPROSYN) 500 MG tablet Take 500 mg by mouth 2 (two) times daily.  . nortriptyline (PAMELOR) 25 MG capsule Take 1 capsule by mouth at bedtime.  . pantoprazole (PROTONIX) 20 MG tablet Take 20 mg by mouth daily. [facility]  . polyethylene glycol powder (GLYCOLAX/MIRALAX) 17 GM/SCOOP powder Take by mouth. uNC  . sucralfate (CARAFATE) 1 GM/10ML suspension Take 10 mLs (1 g total) by mouth 4 (four) times daily -  with meals and at bedtime for 10 days.    PHQ 2/9 Scores 09/19/2020 08/07/2020 03/22/2020 04/07/2018  PHQ - 2 Score 0 2 1 0  PHQ- 9 Score 0 11 2 1   Exception Documentation - - - -    GAD 7 : Generalized Anxiety Score 09/19/2020 08/07/2020  Nervous, Anxious, on Edge 0 3  Control/stop worrying 0 1  Worry too much - different things 0 1  Trouble relaxing 0 1  Restless 0 1  Easily annoyed or irritable 0 1  Afraid - awful might happen 0 0  Total GAD 7 Score 0 8  Anxiety Difficulty - Not difficult at all      BP Readings from Last 3 Encounters:  09/19/20 130/80  08/13/20 (!) 145/93  08/07/20 130/80    Physical Exam Vitals and nursing note reviewed.  Constitutional:      General: She is not irritable.She is not in acute distress.    Appearance: She is not diaphoretic.  HENT:     Head: Normocephalic and atraumatic.     Right Ear: Tympanic membrane, ear canal and external ear normal.     Left Ear: Tympanic membrane, ear canal and external ear normal.     Nose: Nose normal.  Eyes:     General:        Right eye: No discharge.        Left eye: No discharge.     Conjunctiva/sclera: Conjunctivae normal.     Pupils: Pupils are equal, round, and reactive to light.  Neck:     Thyroid: No thyromegaly.     Vascular: No JVD.  Cardiovascular:     Rate and Rhythm: Normal rate and regular rhythm.     Heart sounds: Normal heart sounds. No murmur heard.  No friction rub. No gallop.   Pulmonary:     Effort: Pulmonary effort is normal.     Breath sounds: Normal breath sounds.  Abdominal:  General: Bowel sounds are normal.     Palpations: Abdomen is soft. There is no hepatomegaly, splenomegaly or mass.     Tenderness: There is no abdominal tenderness. There is no right CVA tenderness, left CVA tenderness, guarding or rebound.  Genitourinary:    Rectum: Guaiac result negative. No mass, tenderness or external hemorrhoid.  Musculoskeletal:        General: Normal range of motion.     Cervical back: Normal range of motion and neck supple.  Lymphadenopathy:     Cervical: No cervical adenopathy.  Skin:    General: Skin is warm and dry.  Neurological:     Mental Status: She is alert.     Deep Tendon Reflexes: Reflexes are normal and symmetric.     Wt Readings from Last 3 Encounters:  09/19/20 181 lb (82.1 kg)  08/13/20 181 lb (82.1 kg)  08/07/20 181 lb (82.1 kg)    BP 130/80   Pulse 80   Ht 5\' 5"  (1.651 m)   Wt 181 lb (82.1 kg)   LMP 08/31/2020 (Exact Date)   BMI 30.12 kg/m    Assessment and Plan: 1. Reactive depression Chronic.  Controlled.  Stable.  PHQ is 0.  Continue Wellbutrin XL 150 mg once a day. - buPROPion (WELLBUTRIN XL) 150 MG 24 hr tablet; Take 1 tablet (150 mg total) by mouth daily.  Dispense: 90 tablet; Refill: 1  2. Anxiety Chronic.  Controlled.  Stable.  Gad score 0.  Continue buspirone 7.5 mg 1 twice a day. - busPIRone (BUSPAR) 7.5 MG tablet; Take 1 tablet (7.5 mg total) by mouth 2 (two) times daily.  Dispense: 180 tablet; Refill: 1  3. Hematochezia New onset.  Similar to previous occurrence several years ago.  Bright red blood per rectum noted and this may have been after a constipation concern.  This was associated with abdominal discomfort as well.  Patient was evaluated at emergency room and was put on Carafate.  Follow-up today notes that there is negative for guaiac.  Because of the family history and patient's concern we will refer back to [facility] for any other gastroenterology at [facility] clinic since Dr. Vira Agar by her recollection was the one that did her previous colonoscopy. - Ambulatory referral to Gastroenterology  4. Slow transit constipation Chronic.  Intermittent.  Relatively stable at this time.  However patient is using MiraLAX on a as needed basis we will add docusate sodium 50 mg 1 a day as needed to soften bowel movements and fiber has been encouraged as well. - Ambulatory referral to Gastroenterology - docusate sodium (COLACE) 50 MG capsule; Take 1 capsule (50 mg total) by mouth daily.  Dispense: 90 capsule; Refill: 1

## 2020-10-10 ENCOUNTER — Encounter: Payer: BC Managed Care – PPO | Admitting: Family Medicine

## 2020-10-10 ENCOUNTER — Other Ambulatory Visit: Payer: Self-pay

## 2020-10-10 ENCOUNTER — Encounter: Payer: Self-pay | Admitting: Family Medicine

## 2020-10-27 ENCOUNTER — Emergency Department: Payer: BC Managed Care – PPO

## 2020-10-27 ENCOUNTER — Emergency Department
Admission: EM | Admit: 2020-10-27 | Discharge: 2020-10-27 | Disposition: A | Discharge status: Home / Self Care | Payer: BC Managed Care – PPO | Attending: Emergency Medicine | Admitting: Emergency Medicine

## 2020-10-27 ENCOUNTER — Other Ambulatory Visit: Payer: Self-pay

## 2020-10-27 ENCOUNTER — Encounter: Payer: Self-pay | Admitting: Emergency Medicine

## 2020-10-27 DIAGNOSIS — R319 Hematuria, unspecified: Secondary | ICD-10-CM | POA: Insufficient documentation

## 2020-10-27 DIAGNOSIS — R1010 Upper abdominal pain, unspecified: Secondary | ICD-10-CM | POA: Diagnosis not present

## 2020-10-27 DIAGNOSIS — N390 Urinary tract infection, site not specified: Secondary | ICD-10-CM | POA: Diagnosis not present

## 2020-10-27 DIAGNOSIS — G8929 Other chronic pain: Secondary | ICD-10-CM | POA: Insufficient documentation

## 2020-10-27 LAB — COMPREHENSIVE METABOLIC PANEL
ALT: 13 U/L (ref 0–44)
AST: 16 U/L (ref 15–41)
Albumin: 4.3 g/dL (ref 3.5–5.0)
Alkaline Phosphatase: 43 U/L (ref 38–126)
Anion gap: 8 (ref 5–15)
BUN: 19 mg/dL (ref 6–20)
CO2: 24 mmol/L (ref 22–32)
Calcium: 9 mg/dL (ref 8.9–10.3)
Chloride: 105 mmol/L (ref 98–111)
Creatinine, Ser: 0.76 mg/dL (ref 0.44–1.00)
GFR, Estimated: >60 mL/min (ref >60)
Glucose, Bld: 97 mg/dL (ref 70–99)
Potassium: 3.7 mmol/L (ref 3.5–5.1)
Sodium: 137 mmol/L (ref 135–145)
Total Bilirubin: 0.6 mg/dL (ref 0.3–1.2)
Total Protein: 7.8 g/dL (ref 6.5–8.1)

## 2020-10-27 LAB — CBC
HCT: 39.2 % (ref 36.0–46.0)
Hemoglobin: 13.4 g/dL (ref 12.0–15.0)
MCH: 30 pg (ref 26.0–34.0)
MCHC: 34.2 g/dL (ref 30.0–36.0)
MCV: 87.9 fL (ref 80.0–100.0)
Platelets: 212 10*3/uL (ref 150–400)
RBC: 4.46 MIL/uL (ref 3.87–5.11)
RDW: 13.2 % (ref 11.5–15.5)
WBC: 6 10*3/uL (ref 4.0–10.5)
nRBC: 0 % (ref 0.0–0.2)

## 2020-10-27 LAB — URINALYSIS, COMPLETE (UACMP) WITH MICROSCOPIC
Bilirubin Urine: NEGATIVE
Glucose, UA: NEGATIVE mg/dL
Ketones, ur: NEGATIVE mg/dL
Nitrite: NEGATIVE
Protein, ur: NEGATIVE mg/dL
Specific Gravity, Urine: 1.028 (ref 1.005–1.030)
pH: 5 (ref 5.0–8.0)

## 2020-10-27 LAB — LIPASE, BLOOD: Lipase: 30 U/L (ref 11–51)

## 2020-10-27 LAB — POC URINE PREG, ED: Preg Test, Ur: NEGATIVE

## 2020-10-27 MED ORDER — CEPHALEXIN 500 MG PO CAPS: 500.0000 mg | ORAL_CAPSULE | Freq: Three times a day (TID) | ORAL | 0 refills | Status: DC

## 2020-10-27 MED ORDER — ALUM & MAG HYDROXIDE-SIMETH 200-200-20 MG/5ML PO SUSP
30.0000 mL | Freq: Once | ORAL | Status: AC
  Administered 2020-10-27: 30 mL via ORAL
  Filled 2020-10-27: qty 30

## 2020-10-27 MED ORDER — LIDOCAINE VISCOUS HCL 2 % MT SOLN
15.0000 mL | Freq: Once | OROMUCOSAL | Status: AC
  Administered 2020-10-27: 15 mL via ORAL
  Filled 2020-10-27: qty 15

## 2020-10-27 NOTE — ED Triage Notes (Signed)
Pt reports upper abd pain since September. Pt reports some nausea with it and reports the pain is constant

## 2020-10-27 NOTE — Discharge Instructions (Addendum)
Keep your appointment with [facility] on Monday to see the gastroenterologist.  Avoid spicy or greasy foods.  Increase fluids.  Continue to take MiraLAX daily to avoid constipation.  Discontinue naproxen that was prescribed by your doctor and in August 2021 as this could cause GI problems.  Begin taking the Keflex 500 mg 3 times daily for 10 days until completely finished as you currently have a urinary tract infection that was noted in your lab work.  Continue taking Carafate that was prescribed for you.

## 2020-10-27 NOTE — ED Provider Notes (Signed)
[facility] Emergency Department Provider Note  ____________________________________________   Event Date/Time   First MD Initiated Contact with Patient 10/27/20 1345     (approximate)  I have reviewed the triage vital signs and the nursing notes.   HISTORY  Chief Complaint Abdominal Pain   HPI Brandy Krause is a 34 y.o. female presents to the ED with complaint of upper abdominal pain that is daily but intermittent for nearly 4 months.  Patient reports nausea but no vomiting.  She states that is unrelated to food.  She denies any food dyscrasias.  She describes the pain as sharp and stabbing.  She denies any vomiting or diarrhea.  She also has been seen at 2 other facilities for the same complaint.  She has been prescribed MiraLAX that she should take daily to prevent constipation and Protonix 20 mg.  Patient was also seen at [facility] acute care earlier today and was adamant that she get a CT scan so she was sent to the emergency department.  Patient denies any changes in her chronic abdominal pain.  She states she also has an appointment with the gastroenterologist at [facility] on Monday.  She rates her pain as 7 out of 10.     Past Medical History:  Diagnosis Date   At high risk for breast cancer 05/2016   IBIS = 20.6% PER mYRIAD; RECALCULATE AGE 50   BRCA negative 05/2016   MyRisk neg   Family history of colon cancer 06/30/2016   mom dx'd at 37; rec scr colonoscopies starting at age 34   History of Papanicolaou smear of cervix 10/06/14; 05/06/16   NEG, CT/GC NEG; NEG   MS (multiple sclerosis) ([place])    Multiple sclerosis ([place])     Patient Active Problem List   Diagnosis Date Noted   Change in bowel habits 09/19/2014   FH: colon cancer 09/19/2014   Occult blood in stools 09/19/2014   Constipation 08/03/2014   Multiple sclerosis, relapsing-remitting ([place]) 08/03/2014    Past Surgical History:  Procedure Laterality Date    FOOT SURGERY Right    bunion removal   WISDOM TOOTH EXTRACTION  2005    Prior to Admission medications   Medication Sig Start Date End Date Taking? Authorizing Provider  buPROPion (WELLBUTRIN XL) 150 MG 24 hr tablet Take 1 tablet (150 mg total) by mouth daily. 09/19/20   Juline Patch, MD  busPIRone (BUSPAR) 7.5 MG tablet Take 1 tablet (7.5 mg total) by mouth 2 (two) times daily. 09/19/20   Juline Patch, MD  cephALEXin (KEFLEX) 500 MG capsule Take 1 capsule (500 mg total) by mouth 3 (three) times daily. 10/27/20   Johnn Hai, PA-C  docusate sodium (COLACE) 50 MG capsule Take 1 capsule (50 mg total) by mouth daily. 09/19/20   Juline Patch, MD  naproxen (NAPROSYN) 500 MG tablet Take 500 mg by mouth 2 (two) times daily. 06/29/20   [provider]  nortriptyline (PAMELOR) 25 MG capsule Take 1 capsule by mouth at bedtime. 05/12/18   [provider]  pantoprazole (PROTONIX) 20 MG tablet Take 20 mg by mouth daily. [facility] 08/25/20   [provider]  sucralfate (CARAFATE) 1 GM/10ML suspension Take 10 mLs (1 g total) by mouth 4 (four) times daily -  with meals and at bedtime for 10 days. 08/13/20 09/19/20  Danton Clap, PA-C  medroxyPROGESTERone (DEPO-PROVERA) 150 MG/ML injection Inject 1 mL (150 mg total) into the muscle every 3 (  three) months. 06/29/18 06/28/19  Rexene Agent, CNM    Allergies Patient has no known allergies.  Family History  Problem Relation Age of Onset   Cancer Mother 22       COLON   Other Mother        cervical dysplasia   Cancer Father 6       pancreatic, lung   Lung disease Father    Breast cancer Paternal Aunt 16    Social History Social History   Tobacco Use   Smoking status: Never Smoker   Smokeless tobacco: Never Used  Scientific laboratory technician Use: Never used  Substance Use Topics   Alcohol use: Yes    Comment: LIGHT   Drug use: No    Review of Systems Constitutional: No fever/chills Eyes: No visual  changes. ENT: No sore throat. Cardiovascular: Denies chest pain. Respiratory: Denies shortness of breath. Gastrointestinal: Positive chronic abdominal pain.  No nausea, no vomiting.  No diarrhea.  Positive chronic constipation. Genitourinary: Negative for dysuria. Musculoskeletal: Negative for muscle skeletal pain. Skin: Negative for rash. Neurological: Negative for headaches, focal weakness or numbness. ____________________________________________   PHYSICAL EXAM:  VITAL SIGNS: ED Triage Vitals  Enc Vitals Group     BP 10/27/20 1309 (!) 138/98     Pulse Rate 10/27/20 1309 83     Resp 10/27/20 1309 16     Temp 10/27/20 1309 97.7 F (36.5 C)     Temp Source 10/27/20 1309 Oral     SpO2 10/27/20 1309 100 %     Weight 10/27/20 1244 180 lb (81.6 kg)     Height 10/27/20 1244 '5\' 5"'  (1.651 m)     Head Circumference --      Peak Flow --      Pain Score 10/27/20 1244 7     Pain Loc --      Pain Edu? --      Excl. in [place]? --     Constitutional: Alert and oriented. Well appearing and in no acute distress. Eyes: Conjunctivae are normal.  Head: Atraumatic. Neck: No stridor.   Cardiovascular: Normal rate, regular rhythm. Grossly normal heart sounds.  Good peripheral circulation. Respiratory: Normal respiratory effort.  No retractions. Lungs CTAB. Gastrointestinal: Soft and nontender. No distention.  Bowel sounds normoactive x4 quadrants.  There is minimal diffuse tenderness noted in the epigastric and right upper quadrant.  No periumbilical or lower quadrant pain.  No rebound or referred pain. Musculoskeletal: Moves upper and lower extremities with any difficulty normal gait was noted. Neurologic:  Normal speech and language. No gross focal neurologic deficits are appreciated. No gait instability. Skin:  Skin is warm, dry and intact. No rash noted. Psychiatric: Mood and affect are normal. Speech and behavior are normal.  ____________________________________________   LABS (all labs  ordered are listed, but only abnormal results are displayed)  Labs Reviewed  URINALYSIS, COMPLETE (UACMP) WITH MICROSCOPIC - Abnormal; Notable for the following components:      Result Value   Color, Urine YELLOW (*)    APPearance CLOUDY (*)    Hgb urine dipstick MODERATE (*)    Leukocytes,Ua TRACE (*)    Bacteria, UA RARE (*)    All other components within normal limits  LIPASE, BLOOD  COMPREHENSIVE METABOLIC PANEL  CBC  POC URINE PREG, ED    PROCEDURES  Procedure(s) performed (including Critical Care):  Procedures   ____________________________________________   INITIAL IMPRESSION / ASSESSMENT AND PLAN / ED COURSE  As part  of my medical decision making, I reviewed the following data within the electronic MEDICAL RECORD NUMBER Notes from prior ED visits and [place] Controlled Substance Database  34 year old female presents to the ED with complaint of chronic abdominal pain with history of constipation.  She has been seen at several facilities but has not seen a gastroenterologist as of today.  She was sent to the ED via [facility] acute care when patient was adamant that she get a CT scan.  Patient is having no acute pain, nausea, vomiting or diarrhea.  Lab work was reassuring with the exception of results suggestive of a urinary tract infection.  Patient reports that she has an appointment with gastroenterology at [facility] on Monday.  She is encouraged to keep this appointment.  A prescription for Keflex 500 mg 3 times daily for 10 days was sent to her pharmacy and she is encouraged to increase fluids.  He is to continue with her prescribed medications at this time.  ____________________________________________   FINAL CLINICAL IMPRESSION(S) / ED DIAGNOSES  Final diagnoses:  Urinary tract infection with hematuria, site unspecified  Chronic upper abdominal pain     ED Discharge Orders         Ordered    cephALEXin (KEFLEX) 500 MG capsule  3 times daily         10/27/20 1451          *Please note:  ANAGHA LOSEKE was evaluated in Emergency Department on 10/27/2020 for the symptoms described in the history of present illness. She was evaluated in the context of the global COVID-19 pandemic, which necessitated consideration that the patient might be at risk for infection with the SARS-CoV-2 virus that causes COVID-19. Institutional protocols and algorithms that pertain to the evaluation of patients at risk for COVID-19 are in a state of rapid change based on information released by regulatory bodies including the CDC and federal and state organizations. These policies and algorithms were followed during the patient's care in the ED.  Some ED evaluations and interventions may be delayed as a result of limited staffing during and the pandemic.*   Note:  This document was prepared using Dragon voice recognition software and may include unintentional dictation errors.    Johnn Hai, PA-C 10/27/20 1604    Vladimir Crofts, MD 10/28/20 1026

## 2020-11-17 ENCOUNTER — Ambulatory Visit
Admission: EM | Admit: 2020-11-17 | Discharge: 2020-11-17 | Disposition: A | Discharge status: Home / Self Care | Payer: BC Managed Care – PPO | Attending: Family Medicine | Admitting: Family Medicine

## 2020-11-17 ENCOUNTER — Other Ambulatory Visit: Payer: Self-pay

## 2020-11-17 DIAGNOSIS — Z20822 Contact with and (suspected) exposure to covid-19: Secondary | ICD-10-CM | POA: Diagnosis present

## 2020-11-17 LAB — RESP PANEL BY RT-PCR (FLU A&B, COVID) ARPGX2
Influenza A by PCR: NEGATIVE
Influenza B by PCR: NEGATIVE
SARS Coronavirus 2 by RT PCR: NEGATIVE

## 2020-11-17 NOTE — ED Triage Notes (Signed)
Per patient exposure to covid. Pt. Stated her brother has been diagnose with covid. Patient sated that she has no symptoms.

## 2020-11-20 ENCOUNTER — Ambulatory Visit
Admission: EM | Admit: 2020-11-20 | Discharge: 2020-11-20 | Disposition: A | Discharge status: Home / Self Care | Payer: BC Managed Care – PPO | Attending: Family Medicine | Admitting: Family Medicine

## 2020-11-20 ENCOUNTER — Other Ambulatory Visit: Payer: Self-pay

## 2020-11-20 ENCOUNTER — Encounter: Payer: Self-pay | Admitting: Emergency Medicine

## 2020-11-20 DIAGNOSIS — Z20822 Contact with and (suspected) exposure to covid-19: Secondary | ICD-10-CM | POA: Diagnosis present

## 2020-11-20 DIAGNOSIS — B349 Viral infection, unspecified: Secondary | ICD-10-CM | POA: Diagnosis present

## 2020-11-20 DIAGNOSIS — U071 COVID-19: Secondary | ICD-10-CM | POA: Insufficient documentation

## 2020-11-20 NOTE — ED Provider Notes (Signed)
[facility]    CSN: [id number] Arrival date & time: 11/20/20  0933      History   Chief Complaint Chief Complaint  Patient presents with  . Chills    [phone]  . Generalized Body Aches        HPI  35 year old female presents with the above complaints.  Patient reports that her symptoms started on Sunday.  She reports fever, body aches, chills, headache, cough.  She states that her symptoms are now slightly improved.  She has had recent Covid exposure as her brother is positive for COVID-19.  Reports that her discomfort is currently 2/10 in severity.  Described as aching and is generalized.  No known exacerbating factors.  Patient desires testing today.  No other complaints.  Past Medical History:  Diagnosis Date  . At high risk for breast cancer 05/2016   IBIS = 20.6% PER mYRIAD; RECALCULATE AGE 44  . BRCA negative 05/2016   MyRisk neg  . Family history of colon cancer 06/30/2016   mom dx'd at 61; rec scr colonoscopies starting at age 59  . History of Papanicolaou smear of cervix 10/06/14; 05/06/16   NEG, CT/GC NEG; NEG  . MS (multiple sclerosis) ([place])   . Multiple sclerosis [facility])     Patient Active Problem List   Diagnosis Date Noted  . Change in bowel habits 09/19/2014  . FH: colon cancer 09/19/2014  . Occult blood in stools 09/19/2014  . Constipation 08/03/2014  . Multiple sclerosis, relapsing-remitting ([place]) 08/03/2014    Past Surgical History:  Procedure Laterality Date  . FOOT SURGERY Right    bunion removal  . WISDOM TOOTH EXTRACTION  2005    OB History    Gravida  0   Para  0   Term  0   Preterm  0   AB  0   Living  0     SAB  0   IAB  0   Ectopic  0   Multiple  0   Live Births  0            Home Medications    Prior to Admission medications   Medication Sig Start Date End Date Taking? Authorizing Provider  buPROPion (WELLBUTRIN XL) 150 MG 24 hr tablet Take 1 tablet (150 mg total) by mouth daily. 09/19/20   Yes Juline Patch, MD  busPIRone (BUSPAR) 7.5 MG tablet Take 1 tablet (7.5 mg total) by mouth 2 (two) times daily. 09/19/20  Yes Juline Patch, MD  naproxen (NAPROSYN) 500 MG tablet Take 500 mg by mouth 2 (two) times daily. 06/29/20  Yes [provider]  nortriptyline (PAMELOR) 25 MG capsule Take 1 capsule by mouth at bedtime. 05/12/18  Yes [provider]  pantoprazole (PROTONIX) 20 MG tablet Take 20 mg by mouth daily. [facility] 08/25/20  Yes [provider]  sucralfate (CARAFATE) 1 GM/10ML suspension Take 10 mLs (1 g total) by mouth 4 (four) times daily -  with meals and at bedtime for 10 days. 08/13/20 09/19/20 Yes Laurene Footman B, PA-C  docusate sodium (COLACE) 50 MG capsule Take 1 capsule (50 mg total) by mouth daily. 09/19/20   Juline Patch, MD  medroxyPROGESTERone (DEPO-PROVERA) 150 MG/ML injection Inject 1 mL (150 mg total) into the muscle every 3 (three) months. 06/29/18 06/28/19  Rexene Agent, CNM    Family History Family History  Problem Relation Age of Onset  . Cancer Mother 36  COLON  . Other Mother        cervical dysplasia  . Cancer Father 60       pancreatic, lung  . Lung disease Father   . Breast cancer Paternal Aunt 81    Social History Social History   Tobacco Use  . Smoking status: Never Smoker  . Smokeless tobacco: Never Used  Vaping Use  . Vaping Use: Never used  Substance Use Topics  . Alcohol use: Yes    Comment: LIGHT  . Drug use: No     Allergies   Patient has no known allergies.   Review of Systems Review of Systems  Constitutional: Positive for chills and fever.  Respiratory: Positive for cough.   Musculoskeletal:       Body aches.  Neurological: Positive for headaches.   Physical Exam Triage Vital Signs ED Triage Vitals  Enc Vitals Group     BP 11/20/20 1113 (!) 133/91     Pulse Rate 11/20/20 1113 86     Resp 11/20/20 1113 18     Temp 11/20/20 1113 98.3 F (36.8 C)     Temp Source 11/20/20 1113 Oral      SpO2 11/20/20 1113 100 %     Weight 11/20/20 1025 179 lb 14.3 oz (81.6 kg)     Height 11/20/20 1025 '5\' 5"'  (1.651 m)     Head Circumference --      Peak Flow --      Pain Score 11/20/20 1025 2     Pain Loc --      Pain Edu? --      Excl. in [place]? --    Updated Vital Signs BP (!) 133/91   Pulse 86   Temp 98.3 F (36.8 C) (Oral)   Resp 18   Ht '5\' 5"'  (1.651 m)   Wt 81.6 kg   LMP 10/24/2020 (Exact Date)   SpO2 100%   BMI 29.94 kg/m   Visual Acuity Right Eye Distance:   Left Eye Distance:   Bilateral Distance:    Right Eye Near:   Left Eye Near:    Bilateral Near:     Physical Exam Vitals and nursing note reviewed.  Constitutional:      General: She is not in acute distress.    Appearance: Normal appearance. She is not ill-appearing.  HENT:     Head: Normocephalic and atraumatic.  Eyes:     General:        Right eye: No discharge.        Left eye: No discharge.     Conjunctiva/sclera: Conjunctivae normal.  Cardiovascular:     Rate and Rhythm: Normal rate and regular rhythm.     Heart sounds: No murmur heard.   Pulmonary:     Effort: Pulmonary effort is normal. No respiratory distress.     Breath sounds: Normal breath sounds. No wheezing or rales.  Neurological:     Mental Status: She is alert.  Psychiatric:        Mood and Affect: Mood normal.        Behavior: Behavior normal.    UC Treatments / Results  Labs (all labs ordered are listed, but only abnormal results are displayed) Labs Reviewed  SARS CORONAVIRUS 2 (TAT 6-24 HRS)    EKG   Radiology No results found.  Procedures Procedures (including critical care time)  Medications Ordered in UC Medications - No data to display  Initial Impression / Assessment and Plan / UC Course  I have reviewed the triage vital signs and the nursing notes.  Pertinent labs & imaging results that were available during my care of the patient were reviewed by me and considered in my medical decision making  (see chart for details).    35 year old female presents with a viral illness.  Suspected COVID-19 given exposure and symptomatology.  Advised rest, fluids.  Over-the-counter Tylenol and ibuprofen.  Work note given.  Final Clinical Impressions(s) / UC Diagnoses   Final diagnoses:  Viral illness  Close exposure to COVID-19 virus  Suspected COVID-19 virus infection     Discharge Instructions     Rest.   Fluids.  OTC Tylenol and Ibuprofen as needed.  Test result should be back in the next 24 hours.  Take care  Dr. Lacinda Axon    ED Prescriptions    None     PDMP not reviewed this encounter.   Coral Spikes, [place] 11/20/20 1219

## 2020-11-20 NOTE — ED Triage Notes (Signed)
Pt c/o body aches, chills, subjective fever, headache and cough. Started about 2 day ago.

## 2020-11-20 NOTE — Discharge Instructions (Signed)
Rest.   Fluids.  OTC Tylenol and Ibuprofen as needed.  Test result should be back in the next 24 hours.  Take care  Dr. Adriana Simas

## 2020-11-21 LAB — SARS CORONAVIRUS 2 (TAT 6-24 HRS): SARS Coronavirus 2: POSITIVE — AB

## 2020-11-28 ENCOUNTER — Other Ambulatory Visit: Payer: Self-pay | Admitting: Family Medicine

## 2020-11-28 DIAGNOSIS — F329 Major depressive disorder, single episode, unspecified: Secondary | ICD-10-CM

## 2021-03-10 ENCOUNTER — Other Ambulatory Visit: Payer: Self-pay

## 2021-03-10 ENCOUNTER — Encounter: Payer: Self-pay | Admitting: Emergency Medicine

## 2021-03-10 ENCOUNTER — Ambulatory Visit
Admission: EM | Admit: 2021-03-10 | Discharge: 2021-03-10 | Disposition: A | Discharge status: Home / Self Care | Payer: BC Managed Care – PPO | Attending: Emergency Medicine | Admitting: Emergency Medicine

## 2021-03-10 DIAGNOSIS — R319 Hematuria, unspecified: Secondary | ICD-10-CM | POA: Insufficient documentation

## 2021-03-10 DIAGNOSIS — N390 Urinary tract infection, site not specified: Secondary | ICD-10-CM | POA: Diagnosis present

## 2021-03-10 LAB — URINALYSIS, COMPLETE (UACMP) WITH MICROSCOPIC
Bilirubin Urine: NEGATIVE
Glucose, UA: NEGATIVE mg/dL
Ketones, ur: NEGATIVE mg/dL
Nitrite: NEGATIVE
Protein, ur: NEGATIVE mg/dL
Specific Gravity, Urine: 1.025 (ref 1.005–1.030)
pH: 6 (ref 5.0–8.0)

## 2021-03-10 LAB — WET PREP, GENITAL
Clue Cells Wet Prep HPF POC: NONE SEEN
Sperm: NONE SEEN
Trich, Wet Prep: NONE SEEN
Yeast Wet Prep HPF POC: NONE SEEN

## 2021-03-10 MED ORDER — CEPHALEXIN 500 MG PO CAPS: 500.0000 mg | ORAL_CAPSULE | Freq: Two times a day (BID) | ORAL | 0 refills | Status: DC

## 2021-03-10 MED ORDER — PHENAZOPYRIDINE HCL 200 MG PO TABS: 200.0000 mg | ORAL_TABLET | Freq: Three times a day (TID) | ORAL | 0 refills | Status: DC | PRN

## 2021-03-10 NOTE — ED Provider Notes (Signed)
HPI  SUBJECTIVE:  Brandy Krause is a 35 y.o. female who presents with a month and a half of "discomfort" with urination, urinary urgency, cloudy urine.  She reports nonodorous white medium consistency vaginal discharge during this time.  No dysuria, frequency, cloudy or odorous urine, hematuria, nausea, vomiting, fevers, body aches, abdominal, back, pelvic pain, abnormal vaginal bleeding, genital rash, itching.  [facility], found to have a UTI, and was treated with 10 days of Macrobid, which she states that she finished.   She is in long-term monogamous relationship with a female who is asymptomatic.  STDs are not a concern today.  She tried a macrobid and pushing fluids.  No aggravating or alleviating factors.  Past medical history of MS, COVID January 22, BV, yeast, recurrent UTI. No history of pyelonephritis, nephrolithiasis, gonorrhea, chlamydia, HIV, HSV, syphilis, trichomonas, diabetes.  LMP: Now.  Denies possibility being pregnant.  PMD: [facility].   Past Medical History:  Diagnosis Date  . At high risk for breast cancer 05/2016   IBIS = 20.6% PER mYRIAD; RECALCULATE AGE 25  . BRCA negative 05/2016   MyRisk neg  . Family history of colon cancer 06/30/2016   mom dx'd at 50; rec scr colonoscopies starting at age 92  . History of Papanicolaou smear of cervix 10/06/14; 05/06/16   NEG, CT/GC NEG; NEG  . MS (multiple sclerosis) ([place])   . Multiple sclerosis ([place])     Past Surgical History:  Procedure Laterality Date  . FOOT SURGERY Right    bunion removal  . WISDOM TOOTH EXTRACTION  2005    Family History  Problem Relation Age of Onset  . Cancer Mother 65       COLON  . Other Mother        cervical dysplasia  . Cancer Father 52       pancreatic, lung  . Lung disease Father   . Breast cancer Paternal Aunt 63    Social History   Tobacco Use  . Smoking status: Never Smoker  . Smokeless tobacco: Never Used  Vaping Use  . Vaping Use:  Never used  Substance Use Topics  . Alcohol use: Yes    Comment: LIGHT  . Drug use: No    No current facility-administered medications for this encounter.  Current Outpatient Medications:  .  buPROPion (WELLBUTRIN XL) 150 MG 24 hr tablet, TAKE 1 TABLET(150 MG) BY MOUTH DAILY, Disp: 30 tablet, Rfl: 0 .  busPIRone (BUSPAR) 7.5 MG tablet, Take 1 tablet (7.5 mg total) by mouth 2 (two) times daily., Disp: 180 tablet, Rfl: 1 .  cephALEXin (KEFLEX) 500 MG capsule, Take 1 capsule (500 mg total) by mouth 2 (two) times daily., Disp: 14 capsule, Rfl: 0 .  nortriptyline (PAMELOR) 25 MG capsule, Take 1 capsule by mouth at bedtime., Disp: , Rfl:  .  pantoprazole (PROTONIX) 20 MG tablet, Take 20 mg by mouth daily. [facility], Disp: , Rfl:  .  phenazopyridine (PYRIDIUM) 200 MG tablet, Take 1 tablet (200 mg total) by mouth 3 (three) times daily as needed for pain., Disp: 6 tablet, Rfl: 0 .  docusate sodium (COLACE) 50 MG capsule, Take 1 capsule (50 mg total) by mouth daily., Disp: 90 capsule, Rfl: 1 .  naproxen (NAPROSYN) 500 MG tablet, Take 500 mg by mouth 2 (two) times daily., Disp: , Rfl:  .  sucralfate (CARAFATE) 1 GM/10ML suspension, Take 10 mLs (1 g total) by mouth 4 (four)  times daily -  with meals and at bedtime for 10 days., Disp: 400 mL, Rfl: 0  No Known Allergies   ROS  As noted in HPI.   Physical Exam  BP (!) 137/91 (BP Location: Left Arm)   Pulse 74   Temp 98.2 F (36.8 C) (Oral)   Resp 14   Ht '5\' 5"'  (1.651 m)   Wt 81.6 kg   LMP 03/04/2021 (Approximate)   SpO2 100%   BMI 29.95 kg/m   Constitutional: Well developed, well nourished, no acute distress Eyes:  EOMI, conjunctiva normal bilaterally HENT: Normocephalic, atraumatic,mucus membranes moist Respiratory: Normal inspiratory effort Cardiovascular: Normal rate GI: nondistended.  No suprapubic, flank tenderness Back: No CVAT skin: No rash, skin intact Musculoskeletal: no deformities Neurologic: Alert & oriented x 3, no focal  neuro deficits Psychiatric: Speech and behavior appropriate   ED Course   Medications - No data to display  Orders Placed This Encounter  Procedures  . Wet prep, genital    Standing Status:   Standing    Number of Occurrences:   1  . Urine culture    Standing Status:   Standing    Number of Occurrences:   1    Order Specific Question:   List patient's active antibiotics    Answer:   keflex  . Urinalysis, Complete w Microscopic Urine, Clean Catch    Standing Status:   Standing    Number of Occurrences:   1    Results for orders placed or performed during the hospital encounter of 03/10/21 (from the past 24 hour(s))  Urinalysis, Complete w Microscopic Urine, Clean Catch     Status: Abnormal   Collection Time: 03/10/21  9:20 AM  Result Value Ref Range   Color, Urine YELLOW YELLOW   APPearance HAZY (A) CLEAR   Specific Gravity, Urine 1.025 1.005 - 1.030   pH 6.0 5.0 - 8.0   Glucose, UA NEGATIVE NEGATIVE mg/dL   Hgb urine dipstick LARGE (A) NEGATIVE   Bilirubin Urine NEGATIVE NEGATIVE   Ketones, ur NEGATIVE NEGATIVE mg/dL   Protein, ur NEGATIVE NEGATIVE mg/dL   Nitrite NEGATIVE NEGATIVE   Leukocytes,Ua TRACE (A) NEGATIVE   Squamous Epithelial / LPF 0-5 0 - 5   WBC, UA 6-10 0 - 5 WBC/hpf   RBC / HPF 21-50 0 - 5 RBC/hpf   Bacteria, UA MANY (A) NONE SEEN  Wet prep, genital     Status: Abnormal   Collection Time: 03/10/21  9:22 AM   Specimen: Vaginal  Result Value Ref Range   Yeast Wet Prep HPF POC NONE SEEN NONE SEEN   Trich, Wet Prep NONE SEEN NONE SEEN   Clue Cells Wet Prep HPF POC NONE SEEN NONE SEEN   WBC, Wet Prep HPF POC FEW (A) NONE SEEN   Sperm NONE SEEN    No results found.  ED Clinical Impression  1. Urinary tract infection with hematuria, site unspecified      ED Assessment/Plan  Previous labs reviewed.  Last urine culture grew out staph epidermis resistant to Bactrim, otherwise pansensitive.  Outside records reviewed.  As noted in HPI.  UA has  trace leukocytes and many bacteria.  It is a clean-catch.  Hematuria most likely from menses.  Will send this off for culture to confirm diagnosis and antibiotic choice.  Her wet prep is negative for trichomonas, yeast, BV.  STD's not a concern today so STD testing was not done.  Will send home with Keflex for 7  days, Pyridium.  She will need to follow-up with her PMD if not getting any better.  ER return precautions given.   Discussed labs,  MDM, treatment plan, and plan for follow-up with patient. Discussed sn/sx that should prompt return to the ED. patient agrees with plan.   Meds ordered this encounter  Medications  . phenazopyridine (PYRIDIUM) 200 MG tablet    Sig: Take 1 tablet (200 mg total) by mouth 3 (three) times daily as needed for pain.    Dispense:  6 tablet    Refill:  0  . cephALEXin (KEFLEX) 500 MG capsule    Sig: Take 1 capsule (500 mg total) by mouth 2 (two) times daily.    Dispense:  14 capsule    Refill:  0      *This clinic note was created using [employer]. Therefore, there may be occasional mistakes despite careful proofreading.  ?    Melynda Ripple, MD 03/10/21 1000

## 2021-03-10 NOTE — ED Triage Notes (Signed)
Patient c/o burning when urinating and urinary frequency that started in March.  Patient states that she has frequent UTIs.

## 2021-03-10 NOTE — Discharge Instructions (Addendum)
Your urinalysis showed that you have many bacteria, some leukocytes.  This is suggestive of a urinary tract infection combined with your symptoms.  I will send you home on Keflex for the urinary tract infection and Pyridium to help with your symptoms.  It will turn your urine orange.  [facility] Macrobid.  Your wet prep is negative for BV, yeast or trichomonas.

## 2021-03-11 ENCOUNTER — Ambulatory Visit: Payer: BC Managed Care – PPO | Admitting: Family Medicine

## 2021-03-12 LAB — URINE CULTURE

## 2021-04-10 ENCOUNTER — Encounter: Payer: Self-pay | Admitting: Family Medicine

## 2021-04-10 ENCOUNTER — Ambulatory Visit (INDEPENDENT_AMBULATORY_CARE_PROVIDER_SITE_OTHER): Payer: BC Managed Care – PPO | Admitting: Family Medicine

## 2021-04-10 ENCOUNTER — Other Ambulatory Visit: Payer: Self-pay

## 2021-04-10 VITALS — BP 138/100 | HR 80 | Ht 65.0 in | Wt 190.0 lb

## 2021-04-10 DIAGNOSIS — N390 Urinary tract infection, site not specified: Secondary | ICD-10-CM

## 2021-04-10 DIAGNOSIS — N760 Acute vaginitis: Secondary | ICD-10-CM

## 2021-04-10 DIAGNOSIS — B9689 Other specified bacterial agents as the cause of diseases classified elsewhere: Secondary | ICD-10-CM | POA: Diagnosis not present

## 2021-04-10 LAB — POCT URINALYSIS DIPSTICK
Bilirubin, UA: NEGATIVE
Blood, UA: NEGATIVE
Glucose, UA: NEGATIVE
Ketones, UA: NEGATIVE
Leukocytes, UA: NEGATIVE
Nitrite, UA: NEGATIVE
Protein, UA: NEGATIVE
Spec Grav, UA: 1.02 (ref 1.010–1.025)
Urobilinogen, UA: 0.2 E.U./dL
pH, UA: 6 (ref 5.0–8.0)

## 2021-04-10 MED ORDER — METRONIDAZOLE 500 MG PO TABS: 500.0000 mg | ORAL_TABLET | Freq: Two times a day (BID) | ORAL | 0 refills | Status: DC

## 2021-04-10 NOTE — Progress Notes (Signed)
Date:  04/10/2021   Name:  Brandy Krause   DOB:  1986/07/03   MRN:  [id number]   Chief Complaint: Urinary Tract Infection (Cloudy urine- sees "mucous" in urine)  Urinary Tract Infection  This is a recurrent problem. The current episode started in the past 7 days. The problem has been waxing and waning. The pain is moderate. She is sexually active. Pertinent negatives include no chills, discharge, flank pain, frequency, hematuria, hesitancy, nausea, sweats, urgency or vomiting. The treatment provided mild relief. Her past medical history is significant for recurrent UTIs. There is no history of catheterization.  Vaginal Discharge The patient's primary symptoms include vaginal discharge. The patient's pertinent negatives include no genital itching, genital lesions, genital odor, genital rash, missed menses, pelvic pain or vaginal bleeding. This is a recurrent problem. The problem has been waxing and waning. Pertinent negatives include no chills, dysuria, flank pain, frequency, hematuria, nausea, urgency or vomiting.    Lab Results  Component Value Date   CREATININE 0.76 10/27/2020   BUN 19 10/27/2020   NA 137 10/27/2020   K 3.7 10/27/2020   CL 105 10/27/2020   CO2 24 10/27/2020   No results found for: CHOL, HDL, LDLCALC, LDLDIRECT, TRIG, CHOLHDL No results found for: TSH No results found for: HGBA1C Lab Results  Component Value Date   WBC 6.0 10/27/2020   HGB 13.4 10/27/2020   HCT 39.2 10/27/2020   MCV 87.9 10/27/2020   PLT 212 10/27/2020   Lab Results  Component Value Date   ALT 13 10/27/2020   AST 16 10/27/2020   ALKPHOS 43 10/27/2020   BILITOT 0.6 10/27/2020     Review of Systems  Constitutional: Negative for chills.  Gastrointestinal: Negative for nausea and vomiting.  Genitourinary: Positive for vaginal discharge. Negative for dysuria, flank pain, frequency, hematuria, hesitancy, missed menses, pelvic pain and urgency.    Patient Active Problem List   Diagnosis  Date Noted  . Change in bowel habits 09/19/2014  . FH: colon cancer 09/19/2014  . Occult blood in stools 09/19/2014  . Constipation 08/03/2014  . Multiple sclerosis, relapsing-remitting ([place]) 08/03/2014    No Known Allergies  Past Surgical History:  Procedure Laterality Date  . FOOT SURGERY Right    bunion removal  . WISDOM TOOTH EXTRACTION  2005    Social History   Tobacco Use  . Smoking status: Never Smoker  . Smokeless tobacco: Never Used  Vaping Use  . Vaping Use: Never used  Substance Use Topics  . Alcohol use: Yes    Comment: LIGHT  . Drug use: No     Medication list has been reviewed and updated.  Current Meds  Medication Sig  . buPROPion (WELLBUTRIN XL) 150 MG 24 hr tablet TAKE 1 TABLET(150 MG) BY MOUTH DAILY  . busPIRone (BUSPAR) 7.5 MG tablet Take 1 tablet (7.5 mg total) by mouth 2 (two) times daily.  . naproxen (NAPROSYN) 500 MG tablet Take 500 mg by mouth 2 (two) times daily.  . nortriptyline (PAMELOR) 25 MG capsule Take 1 capsule by mouth at bedtime.  . pantoprazole (PROTONIX) 20 MG tablet Take 20 mg by mouth daily. [facility]    [facility] 2/9 Scores 04/10/2021 09/19/2020 08/07/2020 03/22/2020  PHQ - 2 Score 0 0 2 1  PHQ- 9 Score 0 0 11 2  Exception Documentation - - - -    GAD 7 : Generalized Anxiety Score 04/10/2021 09/19/2020 08/07/2020  Nervous, Anxious, on Edge 0 0 3  Control/stop worrying 0  0 1  Worry too much - different things 0 0 1  Trouble relaxing 0 0 1  Restless 0 0 1  Easily annoyed or irritable 0 0 1  Afraid - awful might happen 0 0 0  Total GAD 7 Score 0 0 8  Anxiety Difficulty - - Not difficult at all    BP Readings from Last 3 Encounters:  04/10/21 (!) 138/100  03/10/21 (!) 137/91  11/20/20 (!) 133/91    Physical Exam  Wt Readings from Last 3 Encounters:  04/10/21 190 lb (86.2 kg)  03/10/21 180 lb (81.6 kg)  11/20/20 179 lb 14.3 oz (81.6 kg)    BP (!) 138/100   Pulse 80   Ht 5\' 5"  (1.651 m)   Wt 190 lb (86.2 kg)   LMP 03/10/2021  (Exact Date)   BMI 31.62 kg/m   Assessment and Plan:  1. Recurrent UTI New onset.  Recurrent.  Stable.  Presently patient is not having symptoms of dysuria, frequency, or urgency at this time.  Patient is noted some debris in the toilet when she urinates.  Upon review of her last visit to urgent care it was noted that she was positive for bacterial vaginosis but is only treated for urinary tract infection with Keflex.  Urinalysis is unremarkable but we will send a culture given the recurrent nature of these.  Pending the culture will decide whether or not we will refer to urology. - POCT urinalysis dipstick - UA/M w/rflx Culture, Routine  2. Bacterial vaginosis Patient has had vaginal discharge for several weeks.  Upon evaluation of the vaginal prep it was noted that patient had clue cells indicative of bacterial vaginosis.  We will start her on metronidazole 500 mg twice a day for 7 days and pending culture decide whether or not we refer to urology. - metroNIDAZOLE (FLAGYL) 500 MG tablet; Take 1 tablet (500 mg total) by mouth 2 (two) times daily.  Dispense: 14 tablle: 0

## 2021-04-11 LAB — UA/M W/RFLX CULTURE, ROUTINE

## 2021-04-11 LAB — SPECIMEN STATUS REPORT

## 2021-06-05 ENCOUNTER — Ambulatory Visit
Admission: EM | Admit: 2021-06-05 | Discharge: 2021-06-05 | Disposition: A | Discharge status: Home / Self Care | Payer: BC Managed Care – PPO | Attending: Emergency Medicine | Admitting: Emergency Medicine

## 2021-06-05 ENCOUNTER — Other Ambulatory Visit: Payer: Self-pay

## 2021-06-05 DIAGNOSIS — R0982 Postnasal drip: Secondary | ICD-10-CM | POA: Diagnosis not present

## 2021-06-05 DIAGNOSIS — J309 Allergic rhinitis, unspecified: Secondary | ICD-10-CM | POA: Diagnosis not present

## 2021-06-05 MED ORDER — BENZONATATE 100 MG PO CAPS: 200.0000 mg | ORAL_CAPSULE | Freq: Three times a day (TID) | ORAL | 0 refills | Status: DC

## 2021-06-05 MED ORDER — IPRATROPIUM BROMIDE 0.06 % NA SOLN: 2.0000 | Freq: Four times a day (QID) | NASAL | 12 refills | Status: DC

## 2021-06-05 MED ORDER — PROMETHAZINE-DM 6.25-15 MG/5ML PO SYRP: 5.0000 mL | ORAL_SOLUTION | Freq: Four times a day (QID) | ORAL | 0 refills | Status: DC | PRN

## 2021-06-05 NOTE — ED Provider Notes (Signed)
[facility]    CSN: [id number] Arrival date & time: 06/05/21  1657      History   Chief Complaint Chief Complaint  Patient presents with   neck swelling   Cough    HPI Brandy Krause is a 35 y.o. female.   HPI  35 year old female here for evaluation of respiratory complaints.  Patient reports that she has had swelling to the left side of her throat for the last 2 to 3 weeks and a productive cough for yellow sputum for the last month.  This has not been associated with fever, runny nose, sore throat, shortness of breath, or wheezing.  She states that she has not tried any over-the-counter medications to help her symptoms.  She denies exposure to sick contacts or chemicals at her job.  She is not a smoker.  She is unsure if she has allergies.  Past Medical History:  Diagnosis Date   At high risk for breast cancer 05/2016   IBIS = 20.6% PER mYRIAD; RECALCULATE AGE 79   BRCA negative 05/2016   MyRisk neg   Family history of colon cancer 06/30/2016   mom dx'd at 48; rec scr colonoscopies starting at age 35   History of Papanicolaou smear of cervix 10/06/14; 05/06/16   NEG, CT/GC NEG; NEG   MS (multiple sclerosis) ([place])    Multiple sclerosis ([place])     Patient Active Problem List   Diagnosis Date Noted   Change in bowel habits 09/19/2014   FH: colon cancer 09/19/2014   Occult blood in stools 09/19/2014   Constipation 08/03/2014   Multiple sclerosis, relapsing-remitting ([place]) 08/03/2014    Past Surgical History:  Procedure Laterality Date   FOOT SURGERY Right    bunion removal   WISDOM TOOTH EXTRACTION  2005    OB History     Gravida  0   Para  0   Term  0   Preterm  0   AB  0   Living  0      SAB  0   IAB  0   Ectopic  0   Multiple  0   Live Births  0            Home Medications    Prior to Admission medications   Medication Sig Start Date End Date Taking? Authorizing Provider  benzonatate (TESSALON) 100 MG capsule Take 2  capsules (200 mg total) by mouth every 8 (eight) hours. 06/05/21  Yes Margarette Canada, NP  ipratropium (ATROVENT) 0.06 % nasal spray Place 2 sprays into both nostrils 4 (four) times daily. 06/05/21  Yes Margarette Canada, NP  promethazine-dextromethorphan (PROMETHAZINE-DM) 6.25-15 MG/5ML syrup Take 5 mLs by mouth 4 (four) times daily as needed. 06/05/21  Yes Margarette Canada, NP  medroxyPROGESTERone (DEPO-PROVERA) 150 MG/ML injection Inject 1 mL (150 mg total) into the muscle every 3 (three) months. 06/29/18 06/28/19  Rexene Agent, CNM    Family History Family History  Problem Relation Age of Onset   Cancer Mother 59       COLON   Other Mother        cervical dysplasia   Cancer Father 76       pancreatic, lung   Lung disease Father    Breast cancer Paternal Aunt 63    Social History Social History   Tobacco Use   Smoking status: Never   Smokeless tobacco: Never  Vaping Use   Vaping Use: Never used  Substance Use Topics  Alcohol use: Yes    Comment: LIGHT   Drug use: No     Allergies   Patient has no known allergies.   Review of Systems Review of Systems  Constitutional:  Negative for activity change, appetite change and fever.  HENT:  Negative for congestion, ear pain, rhinorrhea, sore throat and trouble swallowing.   Respiratory:  Positive for cough. Negative for shortness of breath and wheezing.     Physical Exam Triage Vital Signs ED Triage Vitals [06/05/21 1718]  Enc Vitals Group     BP      Pulse      Resp      Temp      Temp src      SpO2      Weight      Height      Head Circumference      Peak Flow      Pain Score 4     Pain Loc      Pain Edu?      Excl. in [place]?    No data found.  Updated Vital Signs BP 127/85 (BP Location: Right Arm)   Pulse 100   Temp 98.7 F (37.1 C) (Oral)   Resp 16   Ht '5\' 5"'  (1.651 m)   Wt 180 lb (81.6 kg)   SpO2 96%   BMI 29.95 kg/m   Visual Acuity Right Eye Distance:   Left Eye Distance:   Bilateral Distance:     Right Eye Near:   Left Eye Near:    Bilateral Near:     Physical Exam Vitals and nursing note reviewed.  Constitutional:      General: She is not in acute distress.    Appearance: Normal appearance. She is normal weight. She is not ill-appearing.  HENT:     Head: Normocephalic and atraumatic.     Right Ear: Tympanic membrane, ear canal and external ear normal. There is no impacted cerumen.     Left Ear: Tympanic membrane, ear canal and external ear normal. There is no impacted cerumen.     Nose: Congestion and rhinorrhea present.     Mouth/Throat:     Mouth: Mucous membranes are moist.     Pharynx: Oropharynx is clear. Posterior oropharyngeal erythema present.  Cardiovascular:     Rate and Rhythm: Normal rate and regular rhythm.     Pulses: Normal pulses.     Heart sounds: Normal heart sounds. No murmur heard.   No gallop.  Pulmonary:     Effort: Pulmonary effort is normal.     Breath sounds: Normal breath sounds. No wheezing, rhonchi or rales.  Musculoskeletal:     Cervical back: Normal range of motion and neck supple.  Lymphadenopathy:     Cervical: No cervical adenopathy.  Skin:    General: Skin is warm and dry.     Capillary Refill: Capillary refill takes less than 2 seconds.     Findings: No erythema or rash.  Neurological:     General: No focal deficit present.     Mental Status: She is alert and oriented to person, place, and time.  Psychiatric:        Mood and Affect: Mood normal.        Behavior: Behavior normal.        Thought Content: Thought content normal.        Judgment: Judgment normal.     UC Treatments / Results  Labs (all labs ordered are listed, but  only abnormal results are displayed) Labs Reviewed - No data to display  EKG   Radiology No results found.  Procedures Procedures (including critical care time)  Medications Ordered in UC Medications - No data to display  Initial Impression / Assessment and Plan / UC Course  I have  reviewed the triage vital signs and the nursing notes.  Pertinent labs & imaging results that were available during my care of the patient were reviewed by me and considered in my medical decision making (see chart for details).  Patient is a very pleasant and nontoxic-appearing 35 year old female here for evaluation of respiratory complaints as outlined in HPI above.  Patient's physical exam reveals pearly gray tympanic membranes bilaterally with a normal light reflex and clear external auditory canals.  Nasal mucosa is pale and edematous with clear nasal discharge.  Oropharyngeal exam reveals posterior oropharyngeal erythema and cobblestoning with clear postnasal drip.  There is no cervical lymphadenopathy appreciated on exam.  Cardiopulmonary exam reveals clear lung sounds in all fields.  Suspect patient's symptoms are allergic rhinitis with postnasal drip that is triggering her cough.  We will treat her accordingly with Atrovent nasal spray, Claritin or Zyrtec over-the-counter, Tessalon Perles, and Promethazine DM cough syrup.   Final Clinical Impressions(s) / UC Diagnoses   Final diagnoses:  Allergic rhinitis with postnasal drip     Discharge Instructions      Use the Atrovent nasal spray, 2 squirts in each nostril every 6 hours, as needed for runny nose and postnasal drip.  Use the Tessalon Perles every 8 hours during the day.  Take them with a small sip of water.  They may give you some numbness to the base of your tongue or a metallic taste in your mouth, this is normal.  Use the Promethazine DM cough syrup at bedtime for cough and congestion.  It will make you drowsy so do not take it during the day.  Take OTC Zyrtec or Claritin daily to help with nasal congestion and postnasal drip.   Return for reevaluation or see your primary care provider for any new or worsening symptoms.      ED Prescriptions     Medication Sig Dispense Auth. Provider   ipratropium (ATROVENT) 0.06 %  nasal spray Place 2 sprays into both nostrils 4 (four) times daily. 15 mL Margarette Canada, NP   benzonatate (TESSALON) 100 MG capsule Take 2 capsules (200 mg total) by mouth every 8 (eight) hours. 21 capsule Margarette Canada, NP   promethazine-dextromethorphan (PROMETHAZINE-DM) 6.25-15 MG/5ML syrup Take 5 mLs by mouth 4 (four) times daily as needed. 118 mL Margarette Canada, NP      PDMP not reviewed this encounter.   Margarette Canada, NP 06/05/21 1737

## 2021-06-05 NOTE — ED Triage Notes (Signed)
Left side gland swelling for 2 weeks, cough has been [id number]

## 2021-06-05 NOTE — Discharge Instructions (Addendum)
Use the Atrovent nasal spray, 2 squirts in each nostril every 6 hours, as needed for runny nose and postnasal drip.  Use the Tessalon Perles every 8 hours during the day.  Take them with a small sip of water.  They may give you some numbness to the base of your tongue or a metallic taste in your mouth, this is normal.  Use the Promethazine DM cough syrup at bedtime for cough and congestion.  It will make you drowsy so do not take it during the day.  Take OTC Zyrtec or Claritin daily to help with nasal congestion and postnasal drip.   Return for reevaluation or see your primary care provider for any new or worsening symptoms.

## 2021-08-02 ENCOUNTER — Other Ambulatory Visit: Payer: Self-pay

## 2021-08-02 ENCOUNTER — Ambulatory Visit
Admission: EM | Admit: 2021-08-02 | Discharge: 2021-08-02 | Disposition: A | Discharge status: Home / Self Care | Payer: BC Managed Care – PPO | Attending: Physician Assistant | Admitting: Physician Assistant

## 2021-08-02 DIAGNOSIS — R110 Nausea: Secondary | ICD-10-CM | POA: Diagnosis not present

## 2021-08-02 DIAGNOSIS — Z20822 Contact with and (suspected) exposure to covid-19: Secondary | ICD-10-CM | POA: Diagnosis not present

## 2021-08-02 DIAGNOSIS — J309 Allergic rhinitis, unspecified: Secondary | ICD-10-CM | POA: Diagnosis not present

## 2021-08-02 DIAGNOSIS — R42 Dizziness and giddiness: Secondary | ICD-10-CM | POA: Insufficient documentation

## 2021-08-02 DIAGNOSIS — H6983 Other specified disorders of Eustachian tube, bilateral: Secondary | ICD-10-CM

## 2021-08-02 LAB — SARS CORONAVIRUS 2 (TAT 6-24 HRS): SARS Coronavirus 2: NEGATIVE

## 2021-08-02 MED ORDER — ONDANSETRON 4 MG PO TBDP: 4.0000 mg | ORAL_TABLET | Freq: Three times a day (TID) | ORAL | 0 refills | Status: DC | PRN

## 2021-08-02 MED ORDER — MECLIZINE HCL 25 MG PO TABS: 25.0000 mg | ORAL_TABLET | Freq: Three times a day (TID) | ORAL | 0 refills | Status: DC | PRN

## 2021-08-02 NOTE — ED Triage Notes (Signed)
Patient came in today c/o of dizziness and nausea its been going on since Wednesday. Patient request COVID test.  -MB

## 2021-08-02 NOTE — Discharge Instructions (Signed)
-  Your symptoms of dizziness are likely related to fluid in the inner ear and eustachian tube dysfunction. I have sent meclizine to help. Also consider flonase and taking daily antihistamine for your allergies. Increase rest and fluids -I have sent Zofran for nausea. -You have declined further work-up today but if any symptoms worsen such as if you develop a fever, worsening cough or congestion, chest pain, racing heart, breathing difficulty, abdominal pain, vomiting, dehydration, urinary symptoms or abnormal vaginal discharge, please return for further work-up.  Go to emergency department for any severe acute worsening of any of your symptoms. -COVID test will be back within 24 hours we will call you for positive.  If positive you will need to be isolated 5 days from symptom onset and then wear mask for 5 days.  Care is supportive, taking over-the-counter medication for symptoms.  You should be seen if you develop an uncontrollable fever, breathing difficulty, weakness, etc.

## 2021-08-02 NOTE — ED Provider Notes (Signed)
[facility]    CSN: [id number] Arrival date & time: 08/02/21  0804      History   Chief Complaint Chief Complaint  Patient presents with   Dizziness   Nausea    HPI Brandy Krause is a 35 y.o. female presenting for approximately 2-day history of feeling nauseous and dizzy.  Patient says when she moves her head a certain way or stands up she feels dizzy.  She does have history of cough and congestion over the past couple of months.  Patient reports history of allergies but does not take anything for allergies.  She denies any fever, sore throat, chest pain, breathing difficulty, palpitations, abdominal pain, dysuria.  She does admit to vaginal discharge but says it does not abnormal or malodorous.  She says she has urinary symptoms that come and go but she does not have any urinary symptoms currently.  No possibility of pregnancy.  Reports last menstrual period was 2 weeks ago.  Patient does report that she has had problems with vertigo before.  She request a COVID test but denies any known exposure.  She has not vaccinated for COVID-19.  She has no other concerns.  HPI  Past Medical History:  Diagnosis Date   At high risk for breast cancer 05/2016   IBIS = 20.6% PER mYRIAD; RECALCULATE AGE 25   BRCA negative 05/2016   MyRisk neg   Family history of colon cancer 06/30/2016   mom dx'd at 38; rec scr colonoscopies starting at age 28   History of Papanicolaou smear of cervix 10/06/14; 05/06/16   NEG, CT/GC NEG; NEG   MS (multiple sclerosis) ([place])    Multiple sclerosis ([place])     Patient Active Problem List   Diagnosis Date Noted   Change in bowel habits 09/19/2014   FH: colon cancer 09/19/2014   Occult blood in stools 09/19/2014   Constipation 08/03/2014   Multiple sclerosis, relapsing-remitting ([place]) 08/03/2014    Past Surgical History:  Procedure Laterality Date   FOOT SURGERY Right    bunion removal   WISDOM TOOTH EXTRACTION  2005    OB History      Gravida  0   Para  0   Term  0   Preterm  0   AB  0   Living  0      SAB  0   IAB  0   Ectopic  0   Multiple  0   Live Births  0            Home Medications    Prior to Admission medications   Medication Sig Start Date End Date Taking? Authorizing Provider  meclizine (ANTIVERT) 25 MG tablet Take 1 tablet (25 mg total) by mouth 3 (three) times daily as needed for dizziness. 08/02/21  Yes Laurene Footman B, PA-C  ondansetron (ZOFRAN ODT) 4 MG disintegrating tablet Take 1 tablet (4 mg total) by mouth every 8 (eight) hours as needed for nausea or vomiting. 08/02/21  Yes Danton Clap, PA-C  medroxyPROGESTERone (DEPO-PROVERA) 150 MG/ML injection Inject 1 mL (150 mg total) into the muscle every 3 (three) months. 06/29/18 06/28/19  Rexene Agent, CNM    Family History Family History  Problem Relation Age of Onset   Cancer Mother 72       COLON   Other Mother        cervical dysplasia   Cancer Father 27       pancreatic, lung  Lung disease Father    Breast cancer Paternal Aunt 14    Social History Social History   Tobacco Use   Smoking status: Never   Smokeless tobacco: Never  Vaping Use   Vaping Use: Never used  Substance Use Topics   Alcohol use: Yes    Comment: LIGHT   Drug use: No     Allergies   Patient has no known allergies.   Review of Systems Review of Systems  Constitutional:  Positive for fatigue. Negative for chills, diaphoresis and fever.  HENT:  Positive for congestion and rhinorrhea. Negative for ear pain, sinus pressure, sinus pain and sore throat.   Respiratory:  Positive for cough. Negative for shortness of breath.   Cardiovascular:  Negative for chest pain and palpitations.  Gastrointestinal:  Positive for nausea. Negative for abdominal pain and vomiting.  Genitourinary:  Positive for vaginal discharge (states it is not abnormal). Negative for dysuria and frequency.  Musculoskeletal:  Negative for arthralgias and myalgias.   Skin:  Negative for rash.  Neurological:  Positive for dizziness. Negative for weakness and headaches.  Hematological:  Negative for adenopathy.    Physical Exam Triage Vital Signs ED Triage Vitals  Enc Vitals Group     BP 08/02/21 0830 (!) 133/93     Pulse Rate 08/02/21 0830 76     Resp 08/02/21 0830 18     Temp 08/02/21 0830 97.9 F (36.6 C)     Temp Source 08/02/21 0830 Oral     SpO2 08/02/21 0830 100 %     Weight 08/02/21 0826 190 lb (86.2 kg)     Height 08/02/21 0826 '5\' 5"'  (1.651 m)     Head Circumference --      Peak Flow --      Pain Score 08/02/21 0826 0     Pain Loc --      Pain Edu? --      Excl. in [place]? --    No data found.  Updated Vital Signs BP (!) 133/93 (BP Location: Left Arm)   Pulse 76   Temp 97.9 F (36.6 C) (Oral)   Resp 18   Ht '5\' 5"'  (1.651 m)   Wt 190 lb (86.2 kg)   LMP 07/15/2021   SpO2 100%   BMI 31.62 kg/m      Physical Exam Vitals and nursing note reviewed.  Constitutional:      General: She is not in acute distress.    Appearance: Normal appearance. She is not ill-appearing or toxic-appearing.  HENT:     Head: Normocephalic and atraumatic.     Right Ear: Hearing, ear canal and external ear normal. A middle ear effusion is present.     Left Ear: Hearing, ear canal and external ear normal. A middle ear effusion is present.     Nose: Mucosal edema and congestion present.     Mouth/Throat:     Mouth: Mucous membranes are moist.     Pharynx: Oropharynx is clear.  Eyes:     General: No scleral icterus.       Right eye: No discharge.        Left eye: No discharge.     Extraocular Movements: Extraocular movements intact.     Conjunctiva/sclera: Conjunctivae normal.     Pupils: Pupils are equal, round, and reactive to light.  Cardiovascular:     Rate and Rhythm: Normal rate and regular rhythm.     Heart sounds: Normal heart sounds.  Pulmonary:  Effort: Pulmonary effort is normal. No respiratory distress.     Breath sounds: Normal  breath sounds.  Abdominal:     Palpations: Abdomen is soft.     Tenderness: There is no abdominal tenderness.  Musculoskeletal:     Cervical back: Neck supple.  Skin:    General: Skin is dry.  Neurological:     General: No focal deficit present.     Mental Status: She is alert and oriented to person, place, and time. Mental status is at baseline.     Cranial Nerves: No cranial nerve deficit.     Motor: No weakness.     Gait: Gait normal.  Psychiatric:        Mood and Affect: Mood normal.        Behavior: Behavior normal.        Thought Content: Thought content normal.     UC Treatments / Results  Labs (all labs ordered are listed, but only abnormal results are displayed) Labs Reviewed  SARS CORONAVIRUS 2 (TAT 6-24 HRS)    EKG   Radiology No results found.  Procedures Procedures (including critical care time)  Medications Ordered in UC Medications - No data to display  Initial Impression / Assessment and Plan / UC Course  I have reviewed the triage vital signs and the nursing notes.  Pertinent labs & imaging results that were available during my care of the patient were reviewed by me and considered in my medical decision making (see chart for details).  35 year old female presenting for 2-day history of dizziness, nausea without vomiting and fatigue.  Requesting COVID test.  Vitals are currently normal and stable and she is overall well-appearing.  Exam does reveal nasal congestion and mucosal edema as well as mild effusion of bilateral TMs without evidence of infection.  Chest is clear to auscultation heart regular rate and rhythm.  No abdominal tenderness and abdomen is soft.  I did advise urinalysis since she reports urinary symptoms that come and go and also a wet prep since she has vaginal discharge.  Patient declines both at this time and will return if symptoms worsen.  PCR COVID test obtained.  Current CDC guidelines, isolation protocol and ED precautions  reviewed patient.  Advised patient symptoms was likely related to eustachian tube dysfunction so she does have some fluid behind her ears and history of allergic rhinitis.  Prescribed meclizine and Zofran.  Advised to increase rest and fluids to be careful position changes.  Reviewed return and ED precautions with patient.   Final Clinical Impressions(s) / UC Diagnoses   Final diagnoses:  Dizziness  Dysfunction of both eustachian tubes  Nausea  Allergic rhinitis, unspecified seasonality, unspecified trigger     Discharge Instructions      -Your symptoms of dizziness are likely related to fluid in the inner ear and eustachian tube dysfunction. I have sent meclizine to help. Also consider flonase and taking daily antihistamine for your allergies. Increase rest and fluids -I have sent Zofran for nausea. -You have declined further work-up today but if any symptoms worsen such as if you develop a fever, worsening cough or congestion, chest pain, racing heart, breathing difficulty, abdominal pain, vomiting, dehydration, urinary symptoms or abnormal vaginal discharge, please return for further work-up.  Go to emergency department for any severe acute worsening of any of your symptoms. -COVID test will be back within 24 hours we will call you for positive.  If positive you will need to be isolated 5 days from  symptom onset and then wear mask for 5 days.  Care is supportive, taking over-the-counter medication for symptoms.  You should be seen if you develop an uncontrollable fever, breathing difficulty, weakness, etc.     ED Prescriptions     Medication Sig Dispense Auth. Provider   meclizine (ANTIVERT) 25 MG tablet Take 1 tablet (25 mg total) by mouth 3 (three) times daily as needed for dizziness. 30 tablet Laurene Footman B, PA-C   ondansetron (ZOFRAN ODT) 4 MG disintegrating tablet Take 1 tablet (4 mg total) by mouth every 8 (eight) hours as needed for nausea or vomiting. 20 tablet Gretta Cool      PDMP not reviewed this encounter.   Danton Clap, PA-C 08/02/21 [id number]

## 2021-08-19 DIAGNOSIS — H532 Diplopia: Secondary | ICD-10-CM | POA: Diagnosis not present

## 2021-08-19 DIAGNOSIS — H9313 Tinnitus, bilateral: Secondary | ICD-10-CM | POA: Diagnosis not present

## 2021-08-19 DIAGNOSIS — G35 Multiple sclerosis: Secondary | ICD-10-CM | POA: Diagnosis not present

## 2021-08-19 DIAGNOSIS — H538 Other visual disturbances: Secondary | ICD-10-CM | POA: Diagnosis not present

## 2021-09-16 DIAGNOSIS — H9313 Tinnitus, bilateral: Secondary | ICD-10-CM | POA: Diagnosis not present

## 2021-09-16 DIAGNOSIS — H538 Other visual disturbances: Secondary | ICD-10-CM | POA: Diagnosis not present

## 2021-09-16 DIAGNOSIS — H532 Diplopia: Secondary | ICD-10-CM | POA: Diagnosis not present

## 2021-09-16 DIAGNOSIS — G35 Multiple sclerosis: Secondary | ICD-10-CM | POA: Diagnosis not present

## 2021-11-07 DIAGNOSIS — G35 Multiple sclerosis: Secondary | ICD-10-CM | POA: Diagnosis not present

## 2021-11-27 DIAGNOSIS — G35 Multiple sclerosis: Secondary | ICD-10-CM | POA: Diagnosis not present

## 2021-12-13 ENCOUNTER — Ambulatory Visit (INDEPENDENT_AMBULATORY_CARE_PROVIDER_SITE_OTHER): Payer: BC Managed Care – PPO | Admitting: Licensed Practical Nurse

## 2021-12-13 ENCOUNTER — Encounter: Payer: Self-pay | Admitting: Licensed Practical Nurse

## 2021-12-13 ENCOUNTER — Other Ambulatory Visit (HOSPITAL_COMMUNITY)
Admission: RE | Admit: 2021-12-13 | Discharge: 2021-12-13 | Disposition: A | Discharge status: Home / Self Care | Payer: BC Managed Care – PPO | Source: Ambulatory Visit | Attending: Licensed Practical Nurse | Admitting: Licensed Practical Nurse

## 2021-12-13 ENCOUNTER — Other Ambulatory Visit: Payer: Self-pay

## 2021-12-13 VITALS — BP 100/70 | Ht 65.0 in | Wt 192.0 lb

## 2021-12-13 DIAGNOSIS — N898 Other specified noninflammatory disorders of vagina: Secondary | ICD-10-CM

## 2021-12-13 DIAGNOSIS — R5383 Other fatigue: Secondary | ICD-10-CM

## 2021-12-13 DIAGNOSIS — Z01419 Encounter for gynecological examination (general) (routine) without abnormal findings: Secondary | ICD-10-CM | POA: Insufficient documentation

## 2021-12-13 DIAGNOSIS — Z113 Encounter for screening for infections with a predominantly sexual mode of transmission: Secondary | ICD-10-CM

## 2021-12-13 DIAGNOSIS — Z131 Encounter for screening for diabetes mellitus: Secondary | ICD-10-CM

## 2021-12-13 DIAGNOSIS — Z13220 Encounter for screening for lipoid disorders: Secondary | ICD-10-CM

## 2021-12-13 DIAGNOSIS — Z124 Encounter for screening for malignant neoplasm of cervix: Secondary | ICD-10-CM | POA: Diagnosis not present

## 2021-12-13 NOTE — Progress Notes (Signed)
Gynecology Annual Exam   PCP: Juline Patch, MD  Chief Complaint:  Chief Complaint  Patient presents with   Gynecologic Exam    No concerns    History of Present Illness: Patient is a 36 y.o. G0P0000 presents for annual exam. The patient has  -fatigue x "about  week" -changes in vision-improved with change in prescription to glasses -Discharge-milky white, occurs daily, denies odor or irritation -Headaches-Uses Nortriptyline  -Anxiety-stopped Wellbutrin and Buspar, admits she has anxiety daily, but it does not affect her dailuy activities therefore she is not concerned.    LMP: Patient's last menstrual period was 11/30/2021 (exact date). Average Interval: regular, monthly Duration of flow: 4  to 5 days Heavy Menses: day 2 the heavy, but all other days light  Clots: no Intermenstrual Bleeding: no Postcoital Bleeding: no Dysmenorrhea: no  The patient is sexually active. She currently uses condoms for contraception. She denies dyspareunia.  The patient does perform self breast exams.  There is notable family history of breast or ovarian cancer in her family. There is a family hx of colon CA. Brandy Krause has had colonoscopies.   The patient wears seatbelts: yes.   The patient has regular exercise: yes.   Recently changed to a whole foods/healthy diet, consumes 1400 calories a day and has started a weight training and treadmill routine.   Lives alone, has had the same partner x 3 years, feels safe Works at a [employer]   The patient denies current symptoms of depression.    Review of Systems: ROS see above   Past Medical History:  Patient Active Problem List   Diagnosis Date Noted   Change in bowel habits 09/19/2014   FH: colon cancer 09/19/2014   Occult blood in stools 09/19/2014   Constipation 08/03/2014   Multiple sclerosis, relapsing-remitting ([place]) 08/03/2014    Past Surgical History:  Past Surgical History:  Procedure Laterality Date   FOOT SURGERY  Right    bunion removal   WISDOM TOOTH EXTRACTION  2005    Gynecologic History:  Patient's last menstrual period was 11/30/2021 (exact date). Contraception: condoms Last Pap: Results were: no abnormalities 03/2020   Obstetric History: G0P0000  Family History:  Family History  Problem Relation Age of Onset   Cancer Mother 8       COLON   Other Mother        cervical dysplasia   Cancer Father 80       pancreatic, lung   Lung disease Father    Breast cancer Paternal Aunt 23    Social History:  Social History   Socioeconomic History   Marital status: Single    Spouse name: Not on file   Number of children: 0   Years of education: 16   Highest education level: Bachelor's degree (e.g., BA, AB, BS)  Occupational History   Occupation: Shipper    Comment: full time  Tobacco Use   Smoking status: Never   Smokeless tobacco: Never  Vaping Use   Vaping Use: Never used  Substance and Sexual Activity   Alcohol use: Yes    Comment: LIGHT   Drug use: No   Sexual activity: Yes    Birth control/protection: Condom, None  Other Topics Concern   Not on file  Social History Narrative   Not on file   Social Determinants of Health   Financial Resource Strain: Not on file  Food Insecurity: Not on file  Transportation Needs: Not on file  Physical  Activity: Not on file  Stress: Not on file  Social Connections: Not on file  Intimate Partner Violence: Not on file    Allergies:  No Known Allergies  Medications: Prior to Admission medications   Medication Sig Start Date End Date Taking? Authorizing Provider  nortriptyline (PAMELOR) 10 MG capsule Take 10 mg by mouth at bedtime. 11/18/21  Yes [provider]  medroxyPROGESTERone (DEPO-PROVERA) 150 MG/ML injection Inject 1 mL (150 mg total) into the muscle every 3 (three) months. 06/29/18 06/28/19  Rexene Agent, CNM    Physical Exam Vitals: Blood pressure 100/70, height 5\' 5"  (1.651 m), weight 192 lb (87.1 kg), last  menstrual period 11/30/2021.  General: NAD HEENT: normocephalic, anicteric Thyroid: no enlargement, no palpable nodules Pulmonary: No increased work of breathing, CTAB Cardiovascular: RRR, distal pulses 2+ Breast: Breast symmetrical, no tenderness, no palpable nodules or masses, no skin or nipple retraction present, no nipple discharge.  No axillary or supraclavicular lymphadenopathy. Abdomen: NABS, soft, non-tender, non-distended.  Umbilicus without lesions.  No hepatomegaly, splenomegaly or masses palpable. No evidence of hernia  Genitourinary:  External: Normal external female genitalia.  Normal urethral meatus, normal Bartholin's and Skene's glands.    Vagina: Normal vaginal mucosa, no evidence of prolapse.    Cervix: Grossly normal in appearance, no bleeding  Uterus: Non-enlarged, mobile, normal contour.  No CMT  Adnexa: ovaries non-enlarged, no adnexal masses  Rectal: deferred  Lymphatic: no evidence of inguinal lymphadenopathy Extremities: no edema, erythema, or tenderness Neurologic: Grossly intact Psychiatric: mood appropriate, affect full   Assessment: 36 y.o. G0P0000 routine annual exam Screening for sexually transmitted infection Screening for cervical cancer Plan: Problem List Items Addressed This Visit   None Visit Diagnoses     Well woman exam    -  Primary   Relevant Orders   TSH + free T4   CBC w/Diff/Platelet   Hemoglobin A1c   Cytology - PAP   NuSwab Vaginitis (VG)   Lipid panel   Other fatigue       Relevant Orders   TSH + free T4   CBC w/Diff/Platelet   Screening examination for venereal disease       Relevant Orders   HEP, RPR, HIV Panel   Hepatitis C antibody   NuSwab Vaginitis Plus (VG+)   Hemoglobin A1c   Screening for diabetes mellitus       Cervical cancer screening       Relevant Orders   Cytology - PAP   Vaginal discharge       Relevant Orders   NuSwab Vaginitis (VG)   Screening cholesterol level       Relevant Orders   Lipid panel        2) STI screening  wasoffered and accepted  2)  ASCCP guidelines and rational discussed.  Patient opts for every 5 years screening interval  3) Contraception - the patient is currently using  condoms.  She is happy with her current form of contraception and plans to continue. Does not desire a pregnancy but also wishes to avoid hormones. Non hormonal options reviewed.   4) Routine healthcare maintenance including cholesterol, diabetes screening discussed Ordered today  5) Return in 1 year   Roberto Scales, [place], [facility] 12/13/2021, 4:54 PM

## 2021-12-14 LAB — CBC WITH DIFFERENTIAL/PLATELET
Basophils Absolute: 0 10*3/uL (ref 0.0–0.2)
Basos: 1 %
EOS (ABSOLUTE): 0.1 10*3/uL (ref 0.0–0.4)
Eos: 1 %
Hematocrit: 39.2 % (ref 34.0–46.6)
Hemoglobin: 13.3 g/dL (ref 11.1–15.9)
Immature Grans (Abs): 0 10*3/uL (ref 0.0–0.1)
Immature Granulocytes: 0 %
Lymphocytes Absolute: 1.7 10*3/uL (ref 0.7–3.1)
Lymphs: 19 %
MCH: 29.8 pg (ref 26.6–33.0)
MCHC: 33.9 g/dL (ref 31.5–35.7)
MCV: 88 fL (ref 79–97)
Monocytes Absolute: 0.6 10*3/uL (ref 0.1–0.9)
Monocytes: 7 %
Neutrophils Absolute: 6.2 10*3/uL (ref 1.4–7.0)
Neutrophils: 72 %
Platelets: 256 10*3/uL (ref 150–450)
RBC: 4.47 x10E6/uL (ref 3.77–5.28)
RDW: 12.2 % (ref 11.7–15.4)
WBC: 8.5 10*3/uL (ref 3.4–10.8)

## 2021-12-14 LAB — HEPATITIS C ANTIBODY: Hep C Virus Ab: <0.1 s/co ratio (ref 0.0–0.9)

## 2021-12-14 LAB — HEMOGLOBIN A1C
Est. average glucose Bld gHb Est-mCnc: 114 mg/dL
Hgb A1c MFr Bld: 5.6 % (ref 4.8–5.6)

## 2021-12-14 LAB — HEP, RPR, HIV PANEL
HIV Screen 4th Generation wRfx: NONREACTIVE
Hepatitis B Surface Ag: NEGATIVE
RPR Ser Ql: NONREACTIVE

## 2021-12-14 LAB — TSH+FREE T4
Free T4: 1.23 ng/dL (ref 0.82–1.77)
TSH: 1.15 u[IU]/mL (ref 0.450–4.500)

## 2021-12-16 LAB — HM PAP SMEAR

## 2021-12-17 LAB — NUSWAB VAGINITIS (VG)
Candida albicans, NAA: NEGATIVE
Candida glabrata, NAA: NEGATIVE
Trich vag by NAA: NEGATIVE

## 2021-12-18 ENCOUNTER — Encounter: Payer: Self-pay | Admitting: Licensed Practical Nurse

## 2021-12-18 DIAGNOSIS — G35 Multiple sclerosis: Secondary | ICD-10-CM | POA: Diagnosis not present

## 2021-12-18 DIAGNOSIS — H538 Other visual disturbances: Secondary | ICD-10-CM | POA: Diagnosis not present

## 2021-12-18 DIAGNOSIS — R42 Dizziness and giddiness: Secondary | ICD-10-CM | POA: Diagnosis not present

## 2021-12-18 DIAGNOSIS — H9313 Tinnitus, bilateral: Secondary | ICD-10-CM | POA: Diagnosis not present

## 2021-12-18 NOTE — Telephone Encounter (Signed)
I'm not sure what this means

## 2021-12-19 ENCOUNTER — Encounter: Payer: Self-pay | Admitting: Licensed Practical Nurse

## 2021-12-19 LAB — CYTOLOGY - PAP
Chlamydia: NEGATIVE
Comment: NEGATIVE
Comment: NEGATIVE
Comment: NEGATIVE
Comment: NORMAL
Diagnosis: NEGATIVE
Diagnosis: REACTIVE
High risk HPV: NEGATIVE
Neisseria Gonorrhea: NEGATIVE
Trichomonas: NEGATIVE

## 2022-01-20 ENCOUNTER — Ambulatory Visit (INDEPENDENT_AMBULATORY_CARE_PROVIDER_SITE_OTHER): Payer: BC Managed Care – PPO | Admitting: Nurse Practitioner

## 2022-01-20 ENCOUNTER — Encounter: Payer: Self-pay | Admitting: Nurse Practitioner

## 2022-01-20 ENCOUNTER — Other Ambulatory Visit: Payer: Self-pay

## 2022-01-20 VITALS — BP 123/75 | HR 100 | Temp 97.5°F | Ht 65.0 in | Wt 186.9 lb

## 2022-01-20 DIAGNOSIS — G35 Multiple sclerosis: Secondary | ICD-10-CM

## 2022-01-20 DIAGNOSIS — Z6831 Body mass index (BMI) 31.0-31.9, adult: Secondary | ICD-10-CM | POA: Diagnosis not present

## 2022-01-20 DIAGNOSIS — G43709 Chronic migraine without aura, not intractable, without status migrainosus: Secondary | ICD-10-CM | POA: Diagnosis not present

## 2022-01-20 DIAGNOSIS — Z7689 Persons encountering health services in other specified circumstances: Secondary | ICD-10-CM

## 2022-01-20 DIAGNOSIS — I10 Essential (primary) hypertension: Secondary | ICD-10-CM

## 2022-01-20 MED ORDER — ATENOLOL 25 MG PO TABS
12.5000 mg | ORAL_TABLET | Freq: Every day | ORAL | 1 refills | Status: DC
Start: 2022-01-20 — End: 2022-02-04

## 2022-01-20 NOTE — Progress Notes (Signed)
? ?New Patient Office Visit ? ?Subjective:  ?Patient ID: Brandy Krause, female    DOB: [birth year]  Age: 36 y.o. MRN: [id number] ? ?CC:  ?Chief Complaint  ?Patient presents with  ? New Patient (Initial Visit)  ? ? ?HPI ?Brandy Krause presents to establish new primary care provider. She has not really had primary care. Has recently seen her GYN provider and dentist. States that her blood pressure was elevated and she should establish a new primary care provider to address this issue.  She has two brothers with hypertension. She states that her mother and grandmother both have hypertension.  ?She does get headaches. Sees neurology. Started on nortriptyline due to the frequency and severity of her headaches. Also has MS.  ?-difficulty concentrating and focusing at times. This is effecting her at work and with friends and family.  ? ?Past Medical History:  ?Diagnosis Date  ? At high risk for breast cancer 05/2016  ? IBIS = 20.6% PER mYRIAD; RECALCULATE AGE 16  ? BRCA negative 05/2016  ? MyRisk neg  ? Family history of colon cancer 06/30/2016  ? mom dx'd at 40; rec scr colonoscopies starting at age 21  ? History of Papanicolaou smear of cervix 10/06/14; 05/06/16  ? NEG, CT/GC NEG; NEG  ? MS (multiple sclerosis) ([place])   ? Multiple sclerosis ([place])   ? ? ?Past Surgical History:  ?Procedure Laterality Date  ? FOOT SURGERY Right   ? bunion removal  ? WISDOM TOOTH EXTRACTION  2005  ? ? ?Family History  ?Problem Relation Age of Onset  ? Cancer Mother 11  ?     COLON  ? Other Mother   ?     cervical dysplasia  ? Cancer Father 42  ?     pancreatic, lung  ? Lung disease Father   ? Breast cancer Paternal Aunt 46  ? ? ?Social History  ? ?Socioeconomic History  ? Marital status: Single  ?  Spouse name: Not on file  ? Number of children: 0  ? Years of education: 38  ? Highest education level: Bachelor's degree (e.g., BA, AB, BS)  ?Occupational History  ? 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 Smoking status: Never   ? Smokeless tobacco: Never  ?Vaping Use  ? Vaping Use: Never used  ?Substance and Sexual Activity  ? Alcohol use: Yes  ?  Comment: LIGHT  ? Drug use: No  ? Sexual activity: Yes  ?  Birth control/protection: Condom, None  ?Other Topics Concern  ? Not on file  ?Social History Narrative  ? Not on file  ? ?Social Determinants of Health  ? ?Financial Resource Strain: Not on file  ?Food Insecurity: Not on file  ?Transportation Needs: Not on file  ?Physical Activity: Not on file  ?Stress: Not on file  ?Social Connections: Not on file  ?Intimate Partner Violence: Not on file  ? ? ?ROS ?Review of Systems  ?Constitutional:  Negative for activity change, appetite change, chills, fatigue and fever.  ?HENT:  Negative for congestion, postnasal drip, rhinorrhea, sinus pressure, sinus pain, sneezing and sore throat.   ?Eyes: Negative.   ?Respiratory:  Negative for cough, chest tightness, shortness of breath and wheezing.   ?Cardiovascular:  Negative for chest pain and palpitations.  ?     Mildly elevated HTN and heart rate  ?Gastrointestinal:  Negative for abdominal pain, constipation, diarrhea, nausea and vomiting.  ?Endocrine: Negative for cold intolerance, heat intolerance, polydipsia  and polyuria.  ?Genitourinary:  Negative for dyspareunia, dysuria, flank pain, frequency and urgency.  ?Musculoskeletal:  Negative for arthralgias, back pain and myalgias.  ?Skin:  Negative for rash.  ?Allergic/Immunologic: Negative for environmental allergies.  ?Neurological:  Positive for headaches. Negative for dizziness and weakness.  ?Hematological:  Negative for adenopathy.  ?Psychiatric/Behavioral:  Positive for dysphoric mood. The patient is nervous/anxious.   ? ?Objective:  ? ?Today's Vitals  ? 01/20/22 1110 01/20/22 1134  ?BP: (!) 154/77 123/75  ?Pulse: 100   ?Temp: (!) 97.5 ?F (36.4 ?C)   ?SpO2: 99%   ?Weight: 186 lb 14.4 oz (84.8 kg)   ?Height: '5\' 5"'  (1.651 m)   ? ?Body mass index is 31.1 kg/m?.  ? ?Physical Exam ?Vitals and nursing  note reviewed.  ?Constitutional:   ?   Appearance: Normal appearance. She is well-developed.  ?HENT:  ?   Head: Normocephalic and atraumatic.  ?Eyes:  ?   Pupils: Pupils are equal, round, and reactive to light.  ?Cardiovascular:  ?   Rate and Rhythm: Normal rate and regular rhythm.  ?   Pulses: Normal pulses.  ?   Heart sounds: Normal heart sounds.  ?Pulmonary:  ?   Effort: Pulmonary effort is normal.  ?   Breath sounds: Normal breath sounds.  ?Abdominal:  ?   Palpations: Abdomen is soft.  ?Musculoskeletal:     ?   General: Normal range of motion.  ?   Cervical back: Normal range of motion and neck supple.  ?Lymphadenopathy:  ?   Cervical: No cervical adenopathy.  ?Skin: ?   General: Skin is warm and dry.  ?   Capillary Refill: Capillary refill takes less than 2 seconds.  ?Neurological:  ?   General: No focal deficit present.  ?   Mental Status: She is alert and oriented to person, place, and time.  ?Psychiatric:     ?   Mood and Affect: Mood normal.     ?   Behavior: Behavior normal.     ?   Thought Content: Thought content normal.     ?   Judgment: Judgment normal.  ? ? ?Assessment & Plan:  ?1. Essential hypertension ?Start atenolol 25 mg tablets, taking 1/2 tablet every evening.  Encouraged her to monitor blood pressure at home.  The goal is to have blood pressure 130/80 or better.  Recommend DASH diet. ?- atenolol (TENORMIN) 25 MG tablet; Take 0.5 tablets (12.5 mg total) by mouth at bedtime.  Dispense: 30 tablet; Refill: 1 ? ?2. Chronic migraine without aura without status migrainosus, not intractable ?Recently started on nortriptyline which does seem to be helping.  Continue regular visits with neurology as scheduled. ? ?3. Multiple sclerosis, relapsing-remitting ([place]) ?Continue regular visits with neurology. ? ?4. Body mass index (BMI) of 31.0-31.9 in adult ?Discussed lowering calorie intake to 1500 calories per day and incorporating exercise into daily routine to help lose weight.  ? ?5. Encounter to  establish care ?Appointment today to establish new primary care provider   ? ?  ?Problem List Items Addressed This Visit   ? ?  ? Cardiovascular and Mediastinum  ? Essential hypertension - Primary  ? Relevant Medications  ? atenolol (TENORMIN) 25 MG tablet  ? Chronic migraine without aura without status migrainosus, not intractable  ? Relevant Medications  ? atenolol (TENORMIN) 25 MG tablet  ?  ? Nervous and Auditory  ? Multiple sclerosis, relapsing-remitting ([place])  ? Relevant Medications  ? ocrelizumab (OCREVUS) 300 MG/10ML injection  ?  ?  Other  ? Body mass index (BMI) of 31.0-31.9 in adult  ? ?Other Visit Diagnoses   ? ? Encounter to establish care      ? ?  ? ? ?Outpatient Encounter Medications as of 01/20/2022  ?Medication Sig  ? atenolol (TENORMIN) 25 MG tablet Take 0.5 tablets (12.5 mg total) by mouth at bedtime.  ? nortriptyline (PAMELOR) 10 MG capsule Take 10 mg by mouth at bedtime.  ? ocrelizumab (OCREVUS) 300 MG/10ML injection Inject into the vein.  ? [DISCONTINUED] medroxyPROGESTERone (DEPO-PROVERA) 150 MG/ML injection Inject 1 mL (150 mg total) into the muscle every 3 (three) months.  ? ?No facility-administered encounter medications on file as of 01/20/2022.  ? ? ?Follow-up: Return in about 2 weeks (around 02/03/2022) for blood pressure.  ? ?Ronnell Freshwater, NP ? ?

## 2022-01-26 DIAGNOSIS — Z6830 Body mass index (BMI) 30.0-30.9, adult: Secondary | ICD-10-CM | POA: Insufficient documentation

## 2022-01-26 DIAGNOSIS — G43709 Chronic migraine without aura, not intractable, without status migrainosus: Secondary | ICD-10-CM | POA: Insufficient documentation

## 2022-01-26 DIAGNOSIS — Z6831 Body mass index (BMI) 31.0-31.9, adult: Secondary | ICD-10-CM | POA: Insufficient documentation

## 2022-01-26 DIAGNOSIS — I10 Essential (primary) hypertension: Secondary | ICD-10-CM | POA: Insufficient documentation

## 2022-01-26 NOTE — Patient Instructions (Signed)
Fat and Cholesterol Restricted Eating Plan Getting too much fat and cholesterol in your diet may cause health problems. Choosing the right foods helps keep your fat and cholesterol at normal levels. This can keep you from getting certain diseases. Your doctor may recommend an eating plan that includes: Total fat: ______% or less of total calories a day. This is ______g of fat a day. Saturated fat: ______% or less of total calories a day. This is ______g of saturated fat a day. Cholesterol: less than _________mg a day. Fiber: ______g a day. What are tips for following this plan? General tips Work with your doctor to lose weight if you need to. Avoid: Foods with added sugar. Fried foods. Foods with trans fat or partially hydrogenated oils. This includes some margarines and baked goods. If you drink alcohol: Limit how much you have to: 0-1 drink a day for women who are not pregnant. 0-2 drinks a day for men. Know how much alcohol is in a drink. In the [place] one drink equals one 12 oz bottle of beer (355 mL), one 5 oz glass of wine (148 mL), or one 1 oz glass of hard liquor (44 mL). Reading food labels Check food labels for: Trans fats. Partially hydrogenated oils. Saturated fat (g) in each serving. Cholesterol (mg) in each serving. Fiber (g) in each serving. Choose foods with healthy fats, such as: Monounsaturated fats and polyunsaturated fats. These include olive and canola oil, flaxseeds, walnuts, almonds, and seeds. Omega-3 fats. These are found in certain fish, flaxseed oil, and ground flaxseeds. Choose grain products that have whole grains. Look for the word "whole" as the first word in the ingredient list. Cooking Cook foods using low-fat methods. These include baking, boiling, grilling, and broiling. Eat more home-cooked foods. Eat at restaurants and buffets less often. Eat less fast food. Avoid cooking using saturated fats, such as butter, cream, palm oil, palm kernel oil, and  coconut oil. Meal planning  At meals, divide your plate into four equal parts: Fill one-half of your plate with vegetables, green salads, and fruit. Fill one-fourth of your plate with whole grains. Fill one-fourth of your plate with low-fat (lean) protein foods. Eat fish that is high in omega-3 fats at least two times a week. This includes mackerel, tuna, sardines, and salmon. Eat foods that are high in fiber, such as whole grains, beans, apples, pears, berries, broccoli, carrots, peas, and barley. What foods should I eat? Fruits All fresh, canned (in natural juice), or frozen fruits. Vegetables Fresh or frozen vegetables (raw, steamed, roasted, or grilled). Green salads. Grains Whole grains, such as whole wheat or whole grain breads, crackers, cereals, and pasta. Unsweetened oatmeal, bulgur, barley, quinoa, or brown rice. Corn or whole wheat flour tortillas. Meats and other protein foods Ground beef (85% or leaner), grass-fed beef, or beef trimmed of fat. Skinless chicken or turkey. Ground chicken or turkey. Pork trimmed of fat. All fish and seafood. Egg whites. Dried beans, peas, or lentils. Unsalted nuts or seeds. Unsalted canned beans. Nut butters without added sugar or oil. Dairy Low-fat or nonfat dairy products, such as skim or 1% milk, 2% or reduced-fat cheeses, low-fat and fat-free ricotta or cottage cheese, or plain low-fat and nonfat yogurt. Fats and oils Tub margarine without trans fats. Light or reduced-fat mayonnaise and salad dressings. Avocado. Olive, canola, sesame, or safflower oils. The items listed above may not be a complete list of foods and beverages you can eat. Contact a dietitian for more information. What foods   should I avoid? Fruits Canned fruit in heavy syrup. Fruit in cream or butter sauce. Fried fruit. Vegetables Vegetables cooked in cheese, cream, or butter sauce. Fried vegetables. Grains White bread. White pasta. White rice. Cornbread. Bagels, pastries,  and croissants. Crackers and snack foods that contain trans fat and hydrogenated oils. Meats and other protein foods Fatty cuts of meat. Ribs, chicken wings, bacon, sausage, bologna, salami, chitterlings, fatback, hot dogs, bratwurst, and packaged lunch meats. Liver and organ meats. Whole eggs and egg yolks. Chicken and turkey with skin. Fried meat. Dairy Whole or 2% milk, cream, half-and-half, and cream cheese. Whole milk cheeses. Whole-fat or sweetened yogurt. Full-fat cheeses. Nondairy creamers and whipped toppings. Processed cheese, cheese spreads, and cheese curds. Fats and oils Butter, stick margarine, lard, shortening, ghee, or bacon fat. Coconut, palm kernel, and palm oils. Beverages Alcohol. Sugar-sweetened drinks such as sodas, lemonade, and fruit drinks. Sweets and desserts Corn syrup, sugars, honey, and molasses. Candy. Jam and jelly. Syrup. Sweetened cereals. Cookies, pies, cakes, donuts, muffins, and ice cream. The items listed above may not be a complete list of foods and beverages you should avoid. Contact a dietitian for more information. Summary Choosing the right foods helps keep your fat and cholesterol at normal levels. This can keep you from getting certain diseases. At meals, fill one-half of your plate with vegetables, green salads, and fruits. Eat high fiber foods, like whole grains, beans, apples, pears, berries, carrots, peas, and barley. Limit added sugar, saturated fats, alcohol, and fried foods. This information is not intended to replace advice given to you by your health care provider. Make sure you discuss any questions you have with your health care provider. Document Revised: 03/15/2021 Document Reviewed: 03/15/2021 Elsevier Patient Education  2022 Elsevier Inc.  

## 2022-02-04 ENCOUNTER — Ambulatory Visit (INDEPENDENT_AMBULATORY_CARE_PROVIDER_SITE_OTHER): Payer: BC Managed Care – PPO | Admitting: Nurse Practitioner

## 2022-02-04 ENCOUNTER — Encounter: Payer: Self-pay | Admitting: Nurse Practitioner

## 2022-02-04 ENCOUNTER — Other Ambulatory Visit: Payer: Self-pay

## 2022-02-04 VITALS — BP 109/72 | HR 74 | Temp 97.7°F | Ht 65.0 in | Wt 189.9 lb

## 2022-02-04 DIAGNOSIS — F909 Attention-deficit hyperactivity disorder, unspecified type: Secondary | ICD-10-CM | POA: Diagnosis not present

## 2022-02-04 DIAGNOSIS — Z6831 Body mass index (BMI) 31.0-31.9, adult: Secondary | ICD-10-CM | POA: Diagnosis not present

## 2022-02-04 DIAGNOSIS — I10 Essential (primary) hypertension: Secondary | ICD-10-CM

## 2022-02-04 MED ORDER — ATENOLOL 25 MG PO TABS: 25.0000 mg | ORAL_TABLET | Freq: Every day | ORAL | 2 refills | Status: DC

## 2022-02-04 MED ORDER — AMPHETAMINE-DEXTROAMPHETAMINE 5 MG PO TABS: 5.0000 mg | ORAL_TABLET | Freq: Two times a day (BID) | ORAL | 0 refills | Status: DC | PRN

## 2022-02-04 NOTE — Progress Notes (Signed)
Established patient visit   Patient: Brandy Krause   DOB: 1986-03-28   36 y.o. Female  MRN: [id number] Visit Date: 02/04/2022   Chief Complaint  Patient presents with   Follow-up   Hypertension   Subjective    Hypertension Pertinent negatives include no chest pain, headaches, palpitations or shortness of breath.   The patient presents for follow up of hypertension.  -started on atenolol 12.5mg . blood pressure still elevated at work. She has increased to 1 tablet at bedtime. States she is tolerating this well. Blood pressure very good today.  -headaches are generally doing ok. Does see neurology due to this and diagnosis of MS.  -trouble with focus. Mind wonders. States that she starts working on one thing and then mind wonders to issues and concerns at home. Has to set a timer to remind her to get back on track. She states that coworkers are starting to notice this and she does feel a bit embarrassed.  Adult ASRS 1.1 was administered today. Results are as follows:  Adult ADHD Self Report Scale (most recent)     Adult ADHD Self-Report Scale (ASRS-v1.1) Symptom Checklist - 02/04/22 0932       Part A   1. How often do you have trouble wrapping up the final details of a project, once the challenging parts have been done? Very Often  2. How often do you have difficulty getting things done in order when you have to do a task that requires organization? Very Often    3. How often do you have problems remembering appointments or obligations? Very Often  4. When you have a task that requires a lot of thought, how often do you avoid or delay getting started? Very Often    5. How often do you fidget or squirm with your hands or feet when you have to sit down for a long time? Very Often  6. How often do you feel overly active and compelled to do things, like you were driven by a motor? Very Often      Part B   7. How often do you make careless mistakes when you have to work on a boring or  difficult project? Very Often  8. How often do you have difficulty keeping your attention when you are doing boring or repetitive work? Very Often    9. How often do you have difficulty concentrating on what people say to you, even when they are speaking to you directly? Very Often  10. How often do you misplace or have difficulty finding things at home or at work? Very Often    11. How often are you distracted by activity or noise around you? Very Often  12. How often do you leave your seat in meetings or other situations in which you are expected to remain seated? Often    13. How often do you feel restless or fidgety? Very Often  14. How often do you have difficulty unwinding and relaxing when you have time to yourself? Very Often    15. How often do you find yourself talking too much when you are in social situations? Sometimes  16. When you are in a conversation, how often do you find yourself finishing the sentences of the people you are talking to, before they can finish them themselves? Very Often    17. How often do you have difficulty waiting your turn in situations when turn taking is required? Very Often  18. How often do you  interrupt others when they are busy? Often      Comment   How old were you when these problems first began to occur? 33                 Medications: Outpatient Medications Prior to Visit  Medication Sig   nortriptyline (PAMELOR) 10 MG capsule Take 10 mg by mouth at bedtime.   ocrelizumab (OCREVUS) 300 MG/10ML injection Inject into the vein.   [DISCONTINUED] atenolol (TENORMIN) 25 MG tablet Take 0.5 tablets (12.5 mg total) by mouth at bedtime.   No facility-administered medications prior to visit.    Review of Systems  Constitutional:  Positive for fatigue. Negative for activity change, appetite change, chills and fever.  HENT:  Negative for congestion, postnasal drip, rhinorrhea, sinus pressure, sinus pain, sneezing and sore throat.   Eyes: Negative.    Respiratory:  Negative for cough, chest tightness, shortness of breath and wheezing.   Cardiovascular:  Negative for chest pain and palpitations.       Patient increased dosing of atenolol to 1 tablet daily monitoring at work was showing that BP was still fluctuating high. Tolerating the dose well.   Gastrointestinal:  Negative for abdominal pain, constipation, diarrhea, nausea and vomiting.  Endocrine: Negative for cold intolerance, heat intolerance, polydipsia and polyuria.  Genitourinary:  Negative for dyspareunia, dysuria, flank pain, frequency and urgency.  Musculoskeletal:  Negative for arthralgias, back pain and myalgias.  Skin:  Negative for rash.  Allergic/Immunologic: Negative for environmental allergies.  Neurological:  Negative for dizziness, weakness and headaches.  Hematological:  Negative for adenopathy.  Psychiatric/Behavioral:  Positive for decreased concentration. Negative for confusion. The patient is not nervous/anxious.       Objective     Today's Vitals   02/04/22 0850  BP: 109/72  Pulse: 74  Temp: 97.7 F (36.5 C)  SpO2: 100%  Weight: 189 lb 14.4 oz (86.1 kg)  Height: 5\' 5"  (1.651 m)   Body mass index is 31.6 kg/m.   BP Readings from Last 3 Encounters:  02/04/22 109/72  01/20/22 123/75  12/13/21 100/70    Wt Readings from Last 3 Encounters:  02/04/22 189 lb 14.4 oz (86.1 kg)  01/20/22 186 lb 14.4 oz (84.8 kg)  12/13/21 192 lb (87.1 kg)    Physical Exam Vitals and nursing note reviewed.  Constitutional:      Appearance: Normal appearance. She is well-developed.  HENT:     Head: Normocephalic and atraumatic.  Eyes:     Pupils: Pupils are equal, round, and reactive to light.  Cardiovascular:     Rate and Rhythm: Normal rate and regular rhythm.     Pulses: Normal pulses.     Heart sounds: Normal heart sounds.  Pulmonary:     Effort: Pulmonary effort is normal.     Breath sounds: Normal breath sounds.  Abdominal:     Palpations: Abdomen is  soft.  Musculoskeletal:        General: Normal range of motion.     Cervical back: Normal range of motion and neck supple.  Lymphadenopathy:     Cervical: No cervical adenopathy.  Skin:    General: Skin is warm and dry.     Capillary Refill: Capillary refill takes less than 2 seconds.  Neurological:     General: No focal deficit present.     Mental Status: She is alert and oriented to person, place, and time.  Psychiatric:        Mood and Affect: Mood  normal.        Behavior: Behavior normal.        Thought Content: Thought content normal.        Judgment: Judgment normal.      Assessment & Plan    1. Essential hypertension Improved. Continue atenolol at 25mg  daily. Monitor closely. Goal is to have blood pressure 130/80 or better. She voiced understanding.  - atenolol (TENORMIN) 25 MG tablet; Take 1 tablet (25 mg total) by mouth at bedtime.  Dispense: 30 tablet; Refill: 2  2. Adult attention deficit hyperactivity disorder Patient scored 6/6 on ASRS 1.1 today. Will do trial of adderall 5mg  which may be taken up to twice daily. Advised she monitor her blood pressure and heart rate closely as this medication may increase both. Also advised her to discuss this with her neurologist as this lack of focus and attention may be a symptom of her MS or side effect of medication. She voiced understanding and agreement.  - amphetamine-dextroamphetamine (ADDERALL) 5 MG tablet; Take 1 tablet (5 mg total) by mouth 2 (two) times daily as needed.  Dispense: 60 tablet; Refill: 0  3. Body mass index (BMI) of 31.0-31.9 in adult Discussed lowering calorie intake to 1500 calories per day and incorporating exercise into daily routine to help lose weight.    Problem List Items Addressed This Visit       Cardiovascular and Mediastinum   Essential hypertension - Primary   Relevant Medications   atenolol (TENORMIN) 25 MG tablet     Other   Body mass index (BMI) of 31.0-31.9 in adult   Adult attention  deficit hyperactivity disorder   Relevant Medications   amphetamine-dextroamphetamine (ADDERALL) 5 MG tablet     Return in about 4 weeks (around 03/04/2022) for mood, ADD.         Carlean Jews, NP  [facility] [phone] (phone) [phone] (fax)  [facility]

## 2022-02-26 DIAGNOSIS — H538 Other visual disturbances: Secondary | ICD-10-CM | POA: Diagnosis not present

## 2022-02-26 DIAGNOSIS — H532 Diplopia: Secondary | ICD-10-CM | POA: Diagnosis not present

## 2022-02-26 DIAGNOSIS — G35 Multiple sclerosis: Secondary | ICD-10-CM | POA: Diagnosis not present

## 2022-02-26 DIAGNOSIS — H9313 Tinnitus, bilateral: Secondary | ICD-10-CM | POA: Diagnosis not present

## 2022-03-03 NOTE — Progress Notes (Signed)
Established patient visit ? ? ?Patient: Brandy Krause   DOB: 20-Jun-1986   36 y.o. Female  MRN: [id number] ?Visit Date: 03/04/2022 ? ? ?Chief Complaint  ?Patient presents with  ? Follow-up  ? ?Subjective  ?  ?HPI  ?Routine follow up visit ?-trial adderall '5mg'$  twice daily as needed. She states this has helped, but not nearly as much as she would have liked. She denies negative side effects associated with taking this medication.  ?-continues to be on atenolol every evening. Blood pressure is very well managed, even on the low side. Will recommend she try taking 1/2 tablet every evening.  ?-sees neurology due to multiple sclerosis  ?-she has lost six pounds since her last visit. She is eating healthier and exercising more often  ?-she has no new concerns or complaints  ? ? ?Medications: ?Outpatient Medications Prior to Visit  ?Medication Sig  ? atenolol (TENORMIN) 25 MG tablet Take 1 tablet (25 mg total) by mouth at bedtime.  ? nortriptyline (PAMELOR) 10 MG capsule Take 10 mg by mouth at bedtime.  ? ocrelizumab (OCREVUS) 300 MG/10ML injection Inject into the vein.  ? [DISCONTINUED] amphetamine-dextroamphetamine (ADDERALL) 5 MG tablet Take 1 tablet (5 mg total) by mouth 2 (two) times daily as needed.  ? ?No facility-administered medications prior to visit.  ? ? ?Review of Systems  ?Constitutional:  Negative for activity change, appetite change, chills, fatigue and fever.  ?     Improved eating habits and increased exercise have helped her to lose 6 pounds since her last visit   ?HENT:  Negative for congestion, postnasal drip, rhinorrhea, sinus pressure, sinus pain, sneezing and sore throat.   ?Eyes: Negative.   ?Respiratory:  Negative for cough, chest tightness, shortness of breath and wheezing.   ?Cardiovascular:  Negative for chest pain and palpitations.  ?Gastrointestinal:  Negative for abdominal pain, constipation, diarrhea, nausea and vomiting.  ?Endocrine: Negative for cold intolerance, heat intolerance, polydipsia  and polyuria.  ?Genitourinary:  Negative for dyspareunia, dysuria, flank pain, frequency and urgency.  ?Musculoskeletal:  Negative for arthralgias, back pain and myalgias.  ?Skin:  Negative for rash.  ?Allergic/Immunologic: Negative for environmental allergies.  ?Neurological:  Negative for dizziness, weakness and headaches.  ?Hematological:  Negative for adenopathy.  ?Psychiatric/Behavioral:  Positive for decreased concentration. The patient is not nervous/anxious.   ?     Slightly im[roved concentration and focus since adding low dose adderall.   ? ? ? ? Objective  ?  ? ?Today's Vitals  ? 03/04/22 0933  ?BP: 107/68  ?Pulse: 72  ?Temp: 98 ?F (36.7 ?C)  ?SpO2: 100%  ?Weight: 183 lb 14.4 oz (83.4 kg)  ?Height: 5' 4.96" (1.65 m)  ? ?Body mass index is 30.64 kg/m?.  ? ?BP Readings from Last 3 Encounters:  ?03/04/22 107/68  ?02/04/22 109/72  ?01/20/22 123/75  ?  ?Wt Readings from Last 3 Encounters:  ?03/04/22 183 lb 14.4 oz (83.4 kg)  ?02/04/22 189 lb 14.4 oz (86.1 kg)  ?01/20/22 186 lb 14.4 oz (84.8 kg)  ?  ?Physical Exam ?Vitals and nursing note reviewed.  ?Constitutional:   ?   Appearance: Normal appearance. She is well-developed.  ?HENT:  ?   Head: Normocephalic and atraumatic.  ?   Mouth/Throat:  ?   Mouth: Mucous membranes are moist.  ?   Pharynx: Oropharynx is clear.  ?Eyes:  ?   Extraocular Movements: Extraocular movements intact.  ?   Conjunctiva/sclera: Conjunctivae normal.  ?   Pupils: Pupils are equal, round,  and reactive to light.  ?Cardiovascular:  ?   Rate and Rhythm: Normal rate and regular rhythm.  ?   Pulses: Normal pulses.  ?   Heart sounds: Normal heart sounds.  ?Pulmonary:  ?   Effort: Pulmonary effort is normal.  ?   Breath sounds: Normal breath sounds.  ?Abdominal:  ?   Palpations: Abdomen is soft.  ?Musculoskeletal:     ?   General: Normal range of motion.  ?   Cervical back: Normal range of motion and neck supple.  ?Lymphadenopathy:  ?   Cervical: No cervical adenopathy.  ?Skin: ?   General:  Skin is warm and dry.  ?   Capillary Refill: Capillary refill takes less than 2 seconds.  ?Neurological:  ?   General: No focal deficit present.  ?   Mental Status: She is alert and oriented to person, place, and time.  ?Psychiatric:     ?   Mood and Affect: Mood normal.     ?   Behavior: Behavior normal.     ?   Thought Content: Thought content normal.     ?   Judgment: Judgment normal.  ?  ? Assessment & Plan  ?  ?1. Essential hypertension ?Improved. Will have her decrease atenolol dose to 1/2 tablet in the evenings. Continue with improved exercise and eating habits. May consider discontinuing at next visit if blood pressure remains so well managed.  ? ?2. Adult attention deficit hyperactivity disorder ?Increase dose adderall to '10mg'$  up to twice daily as needed. Single 30 day prescription sent to her pharmacy today. Reassess in one month.  ?- amphetamine-dextroamphetamine (ADDERALL) 10 MG tablet; Take 1 tablet (10 mg total) by mouth 2 (two) times daily as needed.  Dispense: 60 tablet; Refill: 0 ? ?3. Multiple sclerosis, relapsing-remitting ([place]) ?Conitnue regular visits with neurology as scheduled.  ? ?4. Body mass index (BMI) of 30.0-30.9 in adult ?Discussed lowering calorie intake to 1500 calories per day and incorporating exercise into daily routine to help lose weight. Has already lost 6 pounds since last visit due to improved eating habits and increased exercise.  ? ? ?Problem List Items Addressed This Visit   ? ?  ? Cardiovascular and Mediastinum  ? Essential hypertension - Primary  ?  ? Nervous and Auditory  ? Multiple sclerosis, relapsing-remitting ([place])  ?  ? Other  ? Body mass index (BMI) of 30.0-30.9 in adult  ? Adult attention deficit hyperactivity disorder  ? Relevant Medications  ? amphetamine-dextroamphetamine (ADDERALL) 10 MG tablet  ?  ? ?Return in about 4 weeks (around 04/01/2022) for blood pressure, ADD.  ?   ? ? ? ? ?Ronnell Freshwater, NP  ?[facility] ?[phone]  (phone) ?[phone] (fax) ? ?[facility]  ?

## 2022-03-04 ENCOUNTER — Encounter: Payer: Self-pay | Admitting: Nurse Practitioner

## 2022-03-04 ENCOUNTER — Ambulatory Visit (INDEPENDENT_AMBULATORY_CARE_PROVIDER_SITE_OTHER): Payer: BC Managed Care – PPO | Admitting: Nurse Practitioner

## 2022-03-04 VITALS — BP 107/68 | HR 72 | Temp 98.0°F | Ht 64.96 in | Wt 183.9 lb

## 2022-03-04 DIAGNOSIS — G35 Multiple sclerosis: Secondary | ICD-10-CM | POA: Diagnosis not present

## 2022-03-04 DIAGNOSIS — Z6830 Body mass index (BMI) 30.0-30.9, adult: Secondary | ICD-10-CM

## 2022-03-04 DIAGNOSIS — I10 Essential (primary) hypertension: Secondary | ICD-10-CM | POA: Diagnosis not present

## 2022-03-04 DIAGNOSIS — F909 Attention-deficit hyperactivity disorder, unspecified type: Secondary | ICD-10-CM | POA: Diagnosis not present

## 2022-03-04 MED ORDER — AMPHETAMINE-DEXTROAMPHETAMINE 10 MG PO TABS: 10.0000 mg | ORAL_TABLET | Freq: Two times a day (BID) | ORAL | 0 refills | Status: DC | PRN

## 2022-03-13 ENCOUNTER — Other Ambulatory Visit: Payer: Self-pay | Admitting: Neurology

## 2022-03-13 DIAGNOSIS — R2689 Other abnormalities of gait and mobility: Secondary | ICD-10-CM

## 2022-03-13 DIAGNOSIS — H532 Diplopia: Secondary | ICD-10-CM

## 2022-03-13 DIAGNOSIS — H538 Other visual disturbances: Secondary | ICD-10-CM

## 2022-03-13 DIAGNOSIS — H9313 Tinnitus, bilateral: Secondary | ICD-10-CM

## 2022-03-13 DIAGNOSIS — R42 Dizziness and giddiness: Secondary | ICD-10-CM

## 2022-03-13 DIAGNOSIS — G35 Multiple sclerosis: Secondary | ICD-10-CM

## 2022-04-01 ENCOUNTER — Ambulatory Visit (INDEPENDENT_AMBULATORY_CARE_PROVIDER_SITE_OTHER): Payer: BC Managed Care – PPO | Admitting: Nurse Practitioner

## 2022-04-01 ENCOUNTER — Encounter: Payer: Self-pay | Admitting: Nurse Practitioner

## 2022-04-01 VITALS — BP 123/77 | HR 81 | Temp 98.0°F | Ht 64.96 in | Wt 184.8 lb

## 2022-04-01 DIAGNOSIS — Z6830 Body mass index (BMI) 30.0-30.9, adult: Secondary | ICD-10-CM

## 2022-04-01 DIAGNOSIS — I10 Essential (primary) hypertension: Secondary | ICD-10-CM

## 2022-04-01 DIAGNOSIS — F909 Attention-deficit hyperactivity disorder, unspecified type: Secondary | ICD-10-CM | POA: Diagnosis not present

## 2022-04-01 MED ORDER — AMPHETAMINE-DEXTROAMPHETAMINE 15 MG PO TABS: 15.0000 mg | ORAL_TABLET | Freq: Two times a day (BID) | ORAL | 0 refills | Status: DC | PRN

## 2022-04-01 NOTE — Progress Notes (Signed)
Established patient visit ? ? ?Patient: Brandy Krause   DOB: 11-16-1986   36 y.o. Female  MRN: [id number] ?Visit Date: 04/01/2022 ? ? ?Chief Complaint  ?Patient presents with  ? Blood Pressure Check  ? ?Subjective  ?  ?HPI  ?Follow up HTN and ADD. History of MS ?-bp has been well managed.  ?-on dose adderall '10mg'$  bid prn. States that this dose does better than 5 mg dose. She is finding it easier to concentrate at work. Getting tasks done on time. States that effectiveness continues to wear off a bit too quickly.  ?-no negative side effects. Blood pressure slightly higher today, but still well managed.  ? ? ?Medications: ?Outpatient Medications Prior to Visit  ?Medication Sig  ? atenolol (TENORMIN) 25 MG tablet Take 1 tablet (25 mg total) by mouth at bedtime.  ? nortriptyline (PAMELOR) 10 MG capsule Take 10 mg by mouth at bedtime.  ? ocrelizumab (OCREVUS) 300 MG/10ML injection Inject into the vein.  ? [DISCONTINUED] amphetamine-dextroamphetamine (ADDERALL) 10 MG tablet Take 1 tablet (10 mg total) by mouth 2 (two) times daily as needed.  ? ?No facility-administered medications prior to visit.  ? ? ?Review of Systems  ?Constitutional:  Negative for activity change, appetite change, chills, fatigue and fever.  ?HENT:  Negative for congestion, postnasal drip, rhinorrhea, sinus pressure, sinus pain, sneezing and sore throat.   ?Eyes: Negative.   ?Respiratory:  Negative for cough, chest tightness, shortness of breath and wheezing.   ?Cardiovascular:  Negative for chest pain and palpitations.  ?Gastrointestinal:  Negative for abdominal pain, constipation, diarrhea, nausea and vomiting.  ?Endocrine: Negative for cold intolerance, heat intolerance, polydipsia and polyuria.  ?Genitourinary:  Negative for dyspareunia, dysuria, flank pain, frequency and urgency.  ?Musculoskeletal:  Negative for arthralgias, back pain and myalgias.  ?Skin:  Negative for rash.  ?Allergic/Immunologic: Negative for environmental allergies.   ?Neurological:  Negative for dizziness, weakness and headaches.  ?Hematological:  Negative for adenopathy.  ?Psychiatric/Behavioral:  Positive for decreased concentration. The patient is not nervous/anxious.   ?     Improved with slightly higher dose adderall. Not lasting her the course of her working day   ? ? ? ? Objective  ?  ? ?Today's Vitals  ? 04/01/22 0906  ?BP: 123/77  ?Pulse: 81  ?Temp: 98 ?F (36.7 ?C)  ?SpO2: 99%  ?Weight: 184 lb 12.8 oz (83.8 kg)  ?Height: 5' 4.96" (1.65 m)  ? ?Body mass index is 30.79 kg/m?.  ? ?BP Readings from Last 3 Encounters:  ?04/01/22 123/77  ?03/04/22 107/68  ?02/04/22 109/72  ?  ?Wt Readings from Last 3 Encounters:  ?04/01/22 184 lb 12.8 oz (83.8 kg)  ?03/04/22 183 lb 14.4 oz (83.4 kg)  ?02/04/22 189 lb 14.4 oz (86.1 kg)  ?  ?Physical Exam ?Vitals and nursing note reviewed.  ?Constitutional:   ?   Appearance: Normal appearance. She is well-developed.  ?HENT:  ?   Head: Normocephalic and atraumatic.  ?   Nose: Nose normal.  ?   Mouth/Throat:  ?   Mouth: Mucous membranes are moist.  ?   Pharynx: Oropharynx is clear.  ?Eyes:  ?   Conjunctiva/sclera: Conjunctivae normal.  ?   Pupils: Pupils are equal, round, and reactive to light.  ?Cardiovascular:  ?   Rate and Rhythm: Normal rate and regular rhythm.  ?   Pulses: Normal pulses.  ?   Heart sounds: Normal heart sounds.  ?Pulmonary:  ?   Effort: Pulmonary effort is normal.  ?  Breath sounds: Normal breath sounds.  ?Abdominal:  ?   Palpations: Abdomen is soft.  ?Musculoskeletal:     ?   General: Normal range of motion.  ?   Cervical back: Normal range of motion and neck supple.  ?Lymphadenopathy:  ?   Cervical: No cervical adenopathy.  ?Skin: ?   General: Skin is warm and dry.  ?   Capillary Refill: Capillary refill takes less than 2 seconds.  ?Neurological:  ?   General: No focal deficit present.  ?   Mental Status: She is alert and oriented to person, place, and time.  ?Psychiatric:     ?   Mood and Affect: Mood normal.     ?    Behavior: Behavior normal.     ?   Thought Content: Thought content normal.     ?   Judgment: Judgment normal.  ?  ? ? Assessment & Plan  ?  ?1. Essential hypertension ?Bp stable. Continue atenolol as prescribed  ? ?2. Adult attention deficit hyperactivity disorder ?Increase adderall to '15mg'$  twice daily. Will follow up one month for further assessment  ?- amphetamine-dextroamphetamine (ADDERALL) 15 MG tablet; Take 1 tablet by mouth 2 (two) times daily as needed.  Dispense: 60 tablet; Refill: 0 ? ?3. Body mass index (BMI) of 30.0-30.9 in adult ?Discussed lowering calorie intake to 1500 calories per day and incorporating exercise into daily routine to help lose weight.  ? ?Problem List Items Addressed This Visit   ? ?  ? Cardiovascular and Mediastinum  ? Essential hypertension - Primary  ?  ? Other  ? Body mass index (BMI) of 30.0-30.9 in adult  ? Adult attention deficit hyperactivity disorder  ? Relevant Medications  ? amphetamine-dextroamphetamine (ADDERALL) 15 MG tablet  ?  ? ?Return in about 4 weeks (around 04/29/2022) for blood pressure, ADD.  ?   ? ? ? ? ?Ronnell Freshwater, NP  ?[facility] ?[phone] (phone) ?[phone] (fax) ? ?[facility]  ?

## 2022-04-03 ENCOUNTER — Ambulatory Visit
Admission: RE | Admit: 2022-04-03 | Discharge: 2022-04-03 | Disposition: A | Discharge status: Home / Self Care | Payer: BC Managed Care – PPO | Source: Ambulatory Visit | Attending: Neurology | Admitting: Neurology

## 2022-04-03 DIAGNOSIS — R2689 Other abnormalities of gait and mobility: Secondary | ICD-10-CM | POA: Diagnosis not present

## 2022-04-03 DIAGNOSIS — H9313 Tinnitus, bilateral: Secondary | ICD-10-CM | POA: Insufficient documentation

## 2022-04-03 DIAGNOSIS — R42 Dizziness and giddiness: Secondary | ICD-10-CM

## 2022-04-03 DIAGNOSIS — H538 Other visual disturbances: Secondary | ICD-10-CM | POA: Diagnosis not present

## 2022-04-03 DIAGNOSIS — H532 Diplopia: Secondary | ICD-10-CM

## 2022-04-03 DIAGNOSIS — G35 Multiple sclerosis: Secondary | ICD-10-CM | POA: Diagnosis not present

## 2022-04-03 MED ORDER — GADOBUTROL 1 MMOL/ML IV SOLN
8.0000 mL | Freq: Once | INTRAVENOUS | Status: AC | PRN
  Administered 2022-04-03: 8 mL via INTRAVENOUS

## 2022-04-29 ENCOUNTER — Ambulatory Visit: Payer: BC Managed Care – PPO | Admitting: Nurse Practitioner

## 2022-04-30 ENCOUNTER — Other Ambulatory Visit: Payer: Self-pay | Admitting: Nurse Practitioner

## 2022-04-30 DIAGNOSIS — I10 Essential (primary) hypertension: Secondary | ICD-10-CM

## 2022-05-30 ENCOUNTER — Other Ambulatory Visit: Payer: Self-pay | Admitting: Physician Assistant

## 2022-05-30 DIAGNOSIS — I10 Essential (primary) hypertension: Secondary | ICD-10-CM

## 2022-06-08 DIAGNOSIS — G35 Multiple sclerosis: Secondary | ICD-10-CM | POA: Diagnosis not present

## 2022-07-08 ENCOUNTER — Encounter: Payer: Self-pay | Admitting: Nurse Practitioner

## 2022-07-08 ENCOUNTER — Ambulatory Visit (INDEPENDENT_AMBULATORY_CARE_PROVIDER_SITE_OTHER): Payer: BC Managed Care – PPO | Admitting: Nurse Practitioner

## 2022-07-08 VITALS — BP 124/78 | HR 79 | Ht 64.96 in | Wt 188.0 lb

## 2022-07-08 DIAGNOSIS — G35 Multiple sclerosis: Secondary | ICD-10-CM

## 2022-07-08 DIAGNOSIS — F909 Attention-deficit hyperactivity disorder, unspecified type: Secondary | ICD-10-CM | POA: Diagnosis not present

## 2022-07-08 DIAGNOSIS — I10 Essential (primary) hypertension: Secondary | ICD-10-CM | POA: Diagnosis not present

## 2022-07-08 MED ORDER — ATENOLOL 25 MG PO TABS: ORAL_TABLET | ORAL | 1 refills | Status: DC

## 2022-07-08 MED ORDER — AMPHETAMINE-DEXTROAMPHETAMINE 15 MG PO TABS: 15.0000 mg | ORAL_TABLET | Freq: Two times a day (BID) | ORAL | 0 refills | Status: DC | PRN

## 2022-07-08 NOTE — Progress Notes (Signed)
Established patient visit   Patient: Brandy Krause   DOB: [birth year]   36 y.o. Female  MRN: [id number] Visit Date: 07/08/2022   Chief Complaint  Patient presents with   Follow-up   Subjective    HPI  History of hypertensin -well managed on current dose atenolol  -active MS which is stable.  - sees neurology on routine basis.  -started adderall. Recently increased dose to 15 mg twice daily as needed - she states that this dose seems to be working very well for her. She has no negative side effects to report.    Medications: Outpatient Medications Prior to Visit  Medication Sig   nortriptyline (PAMELOR) 10 MG capsule Take 10 mg by mouth at bedtime.   ocrelizumab (OCREVUS) 300 MG/10ML injection Inject into the vein.   [DISCONTINUED] amphetamine-dextroamphetamine (ADDERALL) 15 MG tablet Take 1 tablet by mouth 2 (two) times daily as needed.   [DISCONTINUED] atenolol (TENORMIN) 25 MG tablet TAKE 1 TABLET(25 MG) BY MOUTH DAILY   No facility-administered medications prior to visit.    Review of Systems  Constitutional:  Negative for activity change, appetite change, chills, fatigue and fever.  HENT:  Negative for congestion, postnasal drip, rhinorrhea, sinus pressure, sinus pain, sneezing and sore throat.   Eyes: Negative.   Respiratory:  Negative for cough, chest tightness, shortness of breath and wheezing.   Cardiovascular:  Negative for chest pain and palpitations.  Gastrointestinal:  Negative for abdominal pain, constipation, diarrhea, nausea and vomiting.  Endocrine: Negative for cold intolerance, heat intolerance, polydipsia and polyuria.  Genitourinary:  Negative for dyspareunia, dysuria, flank pain, frequency and urgency.  Musculoskeletal:  Negative for arthralgias, back pain and myalgias.  Skin:  Negative for rash.  Allergic/Immunologic: Negative for environmental allergies.  Neurological:  Negative for dizziness, weakness and headaches.       Stable MS  Hematological:   Negative for adenopathy.  Psychiatric/Behavioral:  Positive for decreased concentration. The patient is not nervous/anxious.        Improved with slightly higher dose adderall. Not lasting her the course of her working day      Objective     Today's Vitals   07/08/22 1618  BP: 124/78  Pulse: 79  SpO2: 97%  Weight: 188 lb (85.3 kg)  Height: 5' 4.96" (1.65 m)   Body mass index is 31.32 kg/m.   BP Readings from Last 3 Encounters:  07/08/22 124/78  04/01/22 123/77  03/04/22 107/68    Wt Readings from Last 3 Encounters:  07/08/22 188 lb (85.3 kg)  04/01/22 184 lb 12.8 oz (83.8 kg)  03/04/22 183 lb 14.4 oz (83.4 kg)    Physical Exam Vitals and nursing note reviewed.  Constitutional:      Appearance: Normal appearance. She is well-developed.  HENT:     Head: Normocephalic and atraumatic.     Nose: Nose normal.     Mouth/Throat:     Mouth: Mucous membranes are moist.     Pharynx: Oropharynx is clear.  Eyes:     Conjunctiva/sclera: Conjunctivae normal.     Pupils: Pupils are equal, round, and reactive to light.  Cardiovascular:     Rate and Rhythm: Normal rate and regular rhythm.     Pulses: Normal pulses.     Heart sounds: Normal heart sounds.  Pulmonary:     Effort: Pulmonary effort is normal.     Breath sounds: Normal breath sounds.  Abdominal:     Palpations: Abdomen is soft.  Musculoskeletal:  General: Normal range of motion.     Cervical back: Normal range of motion and neck supple.  Lymphadenopathy:     Cervical: No cervical adenopathy.  Skin:    General: Skin is warm and dry.     Capillary Refill: Capillary refill takes less than 2 seconds.  Neurological:     General: No focal deficit present.     Mental Status: She is alert and oriented to person, place, and time.  Psychiatric:        Mood and Affect: Mood normal.        Behavior: Behavior normal.        Thought Content: Thought content normal.        Judgment: Judgment normal.       Assessment & Plan    1. Essential hypertension Stable.  Continue atenolol 25 mg daily.  Reassess in 3 months. - atenolol (TENORMIN) 25 MG tablet; TAKE 1 TABLET(25 MG) BY MOUTH DAILY  Dispense: 90 tablet; Refill: 1  2. Adult attention deficit hyperactivity disorder Continue with Adderall 15 mg up to twice daily as needed for focus and attention. Three 30-day prescription sent to her pharmacy.  Dates are 07/08/2022, 08/09/2022, and 09/03/2022. - amphetamine-dextroamphetamine (ADDERALL) 15 MG tablet; Take 1 tablet by mouth 2 (two) times daily as needed.  Dispense: 60 tablet; Refill: 0  3. Multiple sclerosis, relapsing-remitting (HCC) Stable.  Continue regular visits with neurology as scheduled.  Problem List Items Addressed This Visit       Cardiovascular and Mediastinum   Essential hypertension - Primary   Relevant Medications   atenolol (TENORMIN) 25 MG tablet     Nervous and Auditory   Multiple sclerosis, relapsing-remitting (HCC)     Other   Adult attention deficit hyperactivity disorder   Relevant Medications   amphetamine-dextroamphetamine (ADDERALL) 15 MG tablet     Return in about 3 months (around 10/08/2022) for blood pressure, ADD.         Ronnell Freshwater, NP  [facility] at [facility] [phone] (phone) [phone] (fax)  [place]

## 2022-09-15 DIAGNOSIS — M79671 Pain in right foot: Secondary | ICD-10-CM | POA: Diagnosis not present

## 2022-09-15 DIAGNOSIS — D2371 Other benign neoplasm of skin of right lower limb, including hip: Secondary | ICD-10-CM | POA: Diagnosis not present

## 2022-11-18 NOTE — Progress Notes (Signed)
Established patient visit   Patient: Brandy Krause   DOB: Sep 18, 1986   37 y.o. Female  MRN: [id number] Visit Date: 11/19/2022   Chief Complaint  Patient presents with   Medication Refill   Subjective    HPI  Follow up  -hypertension  --well managed  -MS --routinely sees neurology  -ADD --currently on Adderall 15 mg twice daily as needed  ---PDMP profile reviewed. Overdose risk score is average at 270. She has no red flags or state indicators.  ---last fill adderall 08/02/2022  -denies negative side effects from taking this medication.    States that she has been feeling fatigued for the past month -denies heavy menstrual periods . -denies shortness of breath, excess cold  -states that taking adderall does help a little with fatigue      Medications: Outpatient Medications Prior to Visit  Medication Sig   atenolol (TENORMIN) 25 MG tablet TAKE 1 TABLET(25 MG) BY MOUTH DAILY   nortriptyline (PAMELOR) 10 MG capsule Take 10 mg by mouth at bedtime.   ocrelizumab (OCREVUS) 300 MG/10ML injection Inject into the vein.   [DISCONTINUED] amphetamine-dextroamphetamine (ADDERALL) 15 MG tablet Take 1 tablet by mouth 2 (two) times daily as needed.   No facility-administered medications prior to visit.    Review of Systems  Constitutional:  Positive for fatigue. Negative for activity change, appetite change, chills and fever.  HENT:  Negative for congestion, postnasal drip, rhinorrhea, sinus pressure, sinus pain, sneezing and sore throat.   Eyes: Negative.   Respiratory:  Negative for cough, chest tightness, shortness of breath and wheezing.   Cardiovascular:  Negative for chest pain and palpitations.  Gastrointestinal:  Negative for abdominal pain, constipation, diarrhea, nausea and vomiting.  Endocrine: Negative for cold intolerance, heat intolerance, polydipsia and polyuria.  Genitourinary:  Negative for dyspareunia, dysuria, flank pain, frequency and urgency.  Musculoskeletal:   Negative for arthralgias, back pain and myalgias.  Skin:  Negative for rash.  Allergic/Immunologic: Negative for environmental allergies.  Neurological:  Negative for dizziness, weakness and headaches.  Hematological:  Negative for adenopathy.  Psychiatric/Behavioral:  Positive for decreased concentration and dysphoric mood. The patient is not nervous/anxious.        Objective     Today's Vitals   11/19/22 1033  BP: 121/84  Pulse: 88  SpO2: 99%  Weight: 192 lb 9.6 oz (87.4 kg)  Height: 5' 4.96" (1.65 m)   Body mass index is 32.09 kg/m.  BP Readings from Last 3 Encounters:  11/19/22 121/84  07/08/22 124/78  04/01/22 123/77    Wt Readings from Last 3 Encounters:  11/19/22 192 lb 9.6 oz (87.4 kg)  07/08/22 188 lb (85.3 kg)  04/01/22 184 lb 12.8 oz (83.8 kg)    Physical Exam Vitals and nursing note reviewed.  Constitutional:      Appearance: Normal appearance. She is well-developed.  HENT:     Head: Normocephalic and atraumatic.     Nose: Nose normal.     Mouth/Throat:     Mouth: Mucous membranes are moist.     Pharynx: Oropharynx is clear.  Eyes:     Conjunctiva/sclera: Conjunctivae normal.     Pupils: Pupils are equal, round, and reactive to light.  Cardiovascular:     Rate and Rhythm: Normal rate and regular rhythm.     Pulses: Normal pulses.     Heart sounds: Normal heart sounds.  Pulmonary:     Effort: Pulmonary effort is normal.     Breath sounds: Normal breath sounds.  Abdominal:     Palpations: Abdomen is soft.  Musculoskeletal:        General: Normal range of motion.     Cervical back: Normal range of motion and neck supple.  Lymphadenopathy:     Cervical: No cervical adenopathy.  Skin:    General: Skin is warm and dry.     Capillary Refill: Capillary refill takes less than 2 seconds.  Neurological:     General: No focal deficit present.     Mental Status: She is alert and oriented to person, place, and time.  Psychiatric:        Mood and  Affect: Mood normal.        Behavior: Behavior normal.        Thought Content: Thought content normal.        Judgment: Judgment normal.    Results for orders placed or performed in visit on 11/19/22  TSH + free T4  Result Value Ref Range   TSH 1.500 0.450 - 4.500 uIU/mL   Free T4 1.38 0.82 - 1.77 ng/dL  VITAMIN D 25 Hydroxy (Vit-D Deficiency, Fractures)  Result Value Ref Range   Vit D, 25-Hydroxy 9.4 (L) 30.0 - 100.0 ng/mL  Hemoglobin A1c  Result Value Ref Range   Hgb A1c MFr Bld 5.6 4.8 - 5.6 %   Est. average glucose Bld gHb Est-mCnc 114 mg/dL  Lipid panel  Result Value Ref Range   Cholesterol, Total 194 100 - 199 mg/dL   Triglycerides 28 0 - 149 mg/dL   HDL 87 >39 mg/dL   VLDL Cholesterol Cal 6 5 - 40 mg/dL   LDL Chol Calc (NIH) 101 (H) 0 - 99 mg/dL   Chol/HDL Ratio 2.2 0.0 - 4.4 ratio  Comprehensive metabolic panel  Result Value Ref Range   Glucose 88 70 - 99 mg/dL   BUN 20 6 - 20 mg/dL   Creatinine, Ser 0.75 0.57 - 1.00 mg/dL   eGFR 106 >59 mL/min/1.73   BUN/Creatinine Ratio 27 (H) 9 - 23   Sodium 138 134 - 144 mmol/L   Potassium 4.1 3.5 - 5.2 mmol/L   Chloride 104 96 - 106 mmol/L   CO2 21 20 - 29 mmol/L   Calcium 9.2 8.7 - 10.2 mg/dL   Total Protein 7.1 6.0 - 8.5 g/dL   Albumin 4.3 3.9 - 4.9 g/dL   Globulin, Total 2.8 1.5 - 4.5 g/dL   Albumin/Globulin Ratio 1.5 1.2 - 2.2   Bilirubin Total 0.3 0.0 - 1.2 mg/dL   Alkaline Phosphatase 61 44 - 121 IU/L   AST 17 0 - 40 IU/L   ALT 12 0 - 32 IU/L  CBC  Result Value Ref Range   WBC 7.3 3.4 - 10.8 x10E3/uL   RBC 4.79 3.77 - 5.28 x10E6/uL   Hemoglobin 14.0 11.1 - 15.9 g/dL   Hematocrit 41.6 34.0 - 46.6 %   MCV 87 79 - 97 fL   MCH 29.2 26.6 - 33.0 pg   MCHC 33.7 31.5 - 35.7 g/dL   RDW 12.1 11.7 - 15.4 %   Platelets 277 150 - 450 x10E3/uL  Ferritin  Result Value Ref Range   Ferritin 55 15 - 150 ng/mL  B12 and Folate Panel  Result Value Ref Range   Vitamin B-12 948 232 - 1,245 pg/mL   Folate 12.1 >3.0 ng/mL     Assessment & Plan    1. Other fatigue Check thyroid and anemia panels. Check vitamin d. Treat any deficiency as  indicated  - TSH + free T4 - VITAMIN D 25 Hydroxy (Vit-D Deficiency, Fractures) - Hemoglobin A1c - Ferritin - B12 and Folate Panel  2. Vitamin D deficiency Check vitamin d level and treat deficiency as indicated.   - VITAMIN D 25 Hydroxy (Vit-D Deficiency, Fractures)  3. H/O iron deficiency anemia Check anemia panel and treat as indicated  - Ferritin - B12 and Folate Panel  4. Adult attention deficit hyperactivity disorder Stable. Continue adderall 15 mg twice daily as needed. Three 30 day prescriptions sent to her pharmacy today. Dates are 11/19/2022, 12/18/2022, and 01/16/2023.  - amphetamine-dextroamphetamine (ADDERALL) 15 MG tablet; Take 1 tablet by mouth 2 (two) times daily as needed.  Dispense: 60 tablet; Refill: 0  5. Healthcare maintenance Routine, fasting labs drawn during today's visit.  - TSH + free T4 - Hemoglobin A1c - Lipid panel - Comprehensive metabolic panel - CBC    Problem List Items Addressed This Visit       Other   Adult attention deficit hyperactivity disorder   Relevant Medications   amphetamine-dextroamphetamine (ADDERALL) 15 MG tablet   Other fatigue - Primary   Relevant Orders   TSH + free T4 (Completed)   VITAMIN D 25 Hydroxy (Vit-D Deficiency, Fractures) (Completed)   Hemoglobin A1c (Completed)   Ferritin (Completed)   B12 and Folate Panel (Completed)   Vitamin D deficiency   Relevant Orders   VITAMIN D 25 Hydroxy (Vit-D Deficiency, Fractures) (Completed)   Other Visit Diagnoses     H/O iron deficiency anemia       Relevant Orders   Ferritin (Completed)   B12 and Folate Panel (Completed)   Healthcare maintenance       Relevant Orders   TSH + free T4 (Completed)   Hemoglobin A1c (Completed)   Lipid panel (Completed)   Comprehensive metabolic panel (Completed)   CBC (Completed)        Return in about 3 months (around  02/18/2023) for blood pressure, med refills.         Ronnell Freshwater, NP  [facility] at [facility] [phone] (phone) [phone] (fax)  [place]

## 2022-11-19 ENCOUNTER — Encounter: Payer: Self-pay | Admitting: Nurse Practitioner

## 2022-11-19 ENCOUNTER — Ambulatory Visit (INDEPENDENT_AMBULATORY_CARE_PROVIDER_SITE_OTHER): Payer: BC Managed Care – PPO | Admitting: Nurse Practitioner

## 2022-11-19 VITALS — BP 121/84 | HR 88 | Ht 64.96 in | Wt 192.6 lb

## 2022-11-19 DIAGNOSIS — F909 Attention-deficit hyperactivity disorder, unspecified type: Secondary | ICD-10-CM

## 2022-11-19 DIAGNOSIS — Z0000 Encounter for general adult medical examination without abnormal findings: Secondary | ICD-10-CM

## 2022-11-19 DIAGNOSIS — E559 Vitamin D deficiency, unspecified: Secondary | ICD-10-CM | POA: Diagnosis not present

## 2022-11-19 DIAGNOSIS — R5383 Other fatigue: Secondary | ICD-10-CM | POA: Diagnosis not present

## 2022-11-19 DIAGNOSIS — Z862 Personal history of diseases of the blood and blood-forming organs and certain disorders involving the immune mechanism: Secondary | ICD-10-CM | POA: Diagnosis not present

## 2022-11-19 MED ORDER — AMPHETAMINE-DEXTROAMPHETAMINE 15 MG PO TABS: 15.0000 mg | ORAL_TABLET | Freq: Two times a day (BID) | ORAL | 0 refills | Status: DC | PRN

## 2022-11-24 DIAGNOSIS — Z862 Personal history of diseases of the blood and blood-forming organs and certain disorders involving the immune mechanism: Secondary | ICD-10-CM | POA: Diagnosis not present

## 2022-11-24 DIAGNOSIS — E559 Vitamin D deficiency, unspecified: Secondary | ICD-10-CM | POA: Diagnosis not present

## 2022-11-24 DIAGNOSIS — R5383 Other fatigue: Secondary | ICD-10-CM | POA: Diagnosis not present

## 2022-11-24 DIAGNOSIS — Z0000 Encounter for general adult medical examination without abnormal findings: Secondary | ICD-10-CM | POA: Diagnosis not present

## 2022-11-25 ENCOUNTER — Other Ambulatory Visit: Payer: Self-pay | Admitting: Nurse Practitioner

## 2022-11-25 ENCOUNTER — Encounter: Payer: Self-pay | Admitting: Nurse Practitioner

## 2022-11-25 DIAGNOSIS — E559 Vitamin D deficiency, unspecified: Secondary | ICD-10-CM

## 2022-11-25 LAB — LIPID PANEL
Chol/HDL Ratio: 2.2 ratio (ref 0.0–4.4)
Cholesterol, Total: 194 mg/dL (ref 100–199)
HDL: 87 mg/dL (ref >39)
LDL Chol Calc (NIH): 101 mg/dL — ABNORMAL HIGH (ref 0–99)
Triglycerides: 28 mg/dL (ref 0–149)
VLDL Cholesterol Cal: 6 mg/dL (ref 5–40)

## 2022-11-25 LAB — CBC
Hematocrit: 41.6 % (ref 34.0–46.6)
Hemoglobin: 14 g/dL (ref 11.1–15.9)
MCH: 29.2 pg (ref 26.6–33.0)
MCHC: 33.7 g/dL (ref 31.5–35.7)
MCV: 87 fL (ref 79–97)
Platelets: 277 10*3/uL (ref 150–450)
RBC: 4.79 x10E6/uL (ref 3.77–5.28)
RDW: 12.1 % (ref 11.7–15.4)
WBC: 7.3 10*3/uL (ref 3.4–10.8)

## 2022-11-25 LAB — B12 AND FOLATE PANEL
Folate: 12.1 ng/mL (ref >3.0)
Vitamin B-12: 948 pg/mL (ref 232–1245)

## 2022-11-25 LAB — COMPREHENSIVE METABOLIC PANEL
ALT: 12 IU/L (ref 0–32)
AST: 17 IU/L (ref 0–40)
Albumin/Globulin Ratio: 1.5 (ref 1.2–2.2)
Albumin: 4.3 g/dL (ref 3.9–4.9)
Alkaline Phosphatase: 61 IU/L (ref 44–121)
BUN/Creatinine Ratio: 27 — ABNORMAL HIGH (ref 9–23)
BUN: 20 mg/dL (ref 6–20)
Bilirubin Total: 0.3 mg/dL (ref 0.0–1.2)
CO2: 21 mmol/L (ref 20–29)
Calcium: 9.2 mg/dL (ref 8.7–10.2)
Chloride: 104 mmol/L (ref 96–106)
Creatinine, Ser: 0.75 mg/dL (ref 0.57–1.00)
Globulin, Total: 2.8 g/dL (ref 1.5–4.5)
Glucose: 88 mg/dL (ref 70–99)
Potassium: 4.1 mmol/L (ref 3.5–5.2)
Sodium: 138 mmol/L (ref 134–144)
Total Protein: 7.1 g/dL (ref 6.0–8.5)
eGFR: 106 mL/min/{1.73_m2} (ref >59)

## 2022-11-25 LAB — TSH+FREE T4
Free T4: 1.38 ng/dL (ref 0.82–1.77)
TSH: 1.5 u[IU]/mL (ref 0.450–4.500)

## 2022-11-25 LAB — HEMOGLOBIN A1C
Est. average glucose Bld gHb Est-mCnc: 114 mg/dL
Hgb A1c MFr Bld: 5.6 % (ref 4.8–5.6)

## 2022-11-25 LAB — VITAMIN D 25 HYDROXY (VIT D DEFICIENCY, FRACTURES): Vit D, 25-Hydroxy: 9.4 ng/mL — ABNORMAL LOW (ref 30.0–100.0)

## 2022-11-25 LAB — FERRITIN: Ferritin: 55 ng/mL (ref 15–150)

## 2022-11-25 MED ORDER — ERGOCALCIFEROL 1.25 MG (50000 UT) PO CAPS: 50000.0000 [IU] | ORAL_CAPSULE | ORAL | 5 refills | Status: DC

## 2022-11-27 DIAGNOSIS — Z79899 Other long term (current) drug therapy: Secondary | ICD-10-CM | POA: Diagnosis not present

## 2022-11-27 DIAGNOSIS — G35 Multiple sclerosis: Secondary | ICD-10-CM | POA: Diagnosis not present

## 2022-12-14 DIAGNOSIS — E559 Vitamin D deficiency, unspecified: Secondary | ICD-10-CM | POA: Insufficient documentation

## 2022-12-14 DIAGNOSIS — R5383 Other fatigue: Secondary | ICD-10-CM | POA: Insufficient documentation

## 2023-02-18 ENCOUNTER — Ambulatory Visit (INDEPENDENT_AMBULATORY_CARE_PROVIDER_SITE_OTHER): Payer: BC Managed Care – PPO | Admitting: Nurse Practitioner

## 2023-02-18 ENCOUNTER — Encounter: Payer: Self-pay | Admitting: Nurse Practitioner

## 2023-02-18 VITALS — BP 136/85 | HR 79 | Ht 64.96 in | Wt 196.5 lb

## 2023-02-18 DIAGNOSIS — G35 Multiple sclerosis: Secondary | ICD-10-CM | POA: Diagnosis not present

## 2023-02-18 DIAGNOSIS — F909 Attention-deficit hyperactivity disorder, unspecified type: Secondary | ICD-10-CM | POA: Diagnosis not present

## 2023-02-18 DIAGNOSIS — I10 Essential (primary) hypertension: Secondary | ICD-10-CM

## 2023-02-18 MED ORDER — AMPHETAMINE-DEXTROAMPHETAMINE 15 MG PO TABS: 15.0000 mg | ORAL_TABLET | Freq: Two times a day (BID) | ORAL | 0 refills | Status: DC | PRN

## 2023-02-18 NOTE — Progress Notes (Signed)
Established patient visit   Patient: Brandy Krause   DOB: 1986/04/07   37 y.o. Female  MRN: [id number] Visit Date: 02/18/2023   Chief Complaint  Patient presents with   Medical Management of Chronic Issues   Subjective    HPI  Follow up  -hypertension  --well managed  -MS --routinely sees neurology  --doing well with treatment  -ADD --currently on Adderall 15 mg twice daily as needed  --states that she only takes on days when she feels like she is really "all over the place." Goes through one prescription in 3 months.  ---PDMP profile reviewed. Overdose risk score is average at 270. She has no red flags or state indicators.  ---last fill adderall 11/30/2022 -denies negative side effects from taking this medication.   She denies chest pain, chest pressure, or shortness of breath. She denies headaches or visual disturbances. She denies abdominal pain, nausea, vomiting, or changes in bowel or bladder habits.      Medications: Outpatient Medications Prior to Visit  Medication Sig   atenolol (TENORMIN) 25 MG tablet TAKE 1 TABLET(25 MG) BY MOUTH DAILY   ergocalciferol (DRISDOL) 1.25 MG (50000 UT) capsule Take 1 capsule (50,000 Units total) by mouth once a week.   nortriptyline (PAMELOR) 10 MG capsule Take 10 mg by mouth at bedtime.   ocrelizumab (OCREVUS) 300 MG/10ML injection Inject into the vein.   [DISCONTINUED] amphetamine-dextroamphetamine (ADDERALL) 15 MG tablet Take 1 tablet by mouth 2 (two) times daily as needed.   No facility-administered medications prior to visit.    Review of Systems See HPI     Last CBC Lab Results  Component Value Date   WBC 7.3 11/24/2022   HGB 14.0 11/24/2022   HCT 41.6 11/24/2022   MCV 87 11/24/2022   MCH 29.2 11/24/2022   RDW 12.1 11/24/2022   PLT 277 11/24/2022   Last metabolic panel Lab Results  Component Value Date   GLUCOSE 88 11/24/2022   NA 138 11/24/2022   K 4.1 11/24/2022   CL 104 11/24/2022   CO2 21 11/24/2022   BUN  20 11/24/2022   CREATININE 0.75 11/24/2022   EGFR 106 11/24/2022   CALCIUM 9.2 11/24/2022   PROT 7.1 11/24/2022   ALBUMIN 4.3 11/24/2022   LABGLOB 2.8 11/24/2022   AGRATIO 1.5 11/24/2022   BILITOT 0.3 11/24/2022   ALKPHOS 61 11/24/2022   AST 17 11/24/2022   ALT 12 11/24/2022   ANIONGAP 8 10/27/2020   Last lipids Lab Results  Component Value Date   CHOL 194 11/24/2022   HDL 87 11/24/2022   LDLCALC 101 (H) 11/24/2022   TRIG 28 11/24/2022   CHOLHDL 2.2 11/24/2022   Last hemoglobin A1c Lab Results  Component Value Date   HGBA1C 5.6 11/24/2022   Last thyroid functions Lab Results  Component Value Date   TSH 1.500 11/24/2022   Last vitamin D Lab Results  Component Value Date   VD25OH 9.4 (L) 11/24/2022       Objective     Today's Vitals   02/18/23 1007  BP: 136/85  Pulse: 79  SpO2: 98%  Weight: 196 lb 8 oz (89.1 kg)  Height: 5' 4.96" (1.65 m)   Body mass index is 32.74 kg/m.  BP Readings from Last 3 Encounters:  02/18/23 136/85  11/19/22 121/84  07/08/22 124/78    Wt Readings from Last 3 Encounters:  02/18/23 196 lb 8 oz (89.1 kg)  11/19/22 192 lb 9.6 oz (87.4 kg)  07/08/22 188 lb (85.3  kg)    Physical Exam Vitals and nursing note reviewed.  Constitutional:      Appearance: Normal appearance. She is well-developed.  HENT:     Head: Normocephalic and atraumatic.     Nose: Nose normal.     Mouth/Throat:     Mouth: Mucous membranes are moist.     Pharynx: Oropharynx is clear.  Eyes:     Extraocular Movements: Extraocular movements intact.     Conjunctiva/sclera: Conjunctivae normal.     Pupils: Pupils are equal, round, and reactive to light.  Neck:     Vascular: No carotid bruit.  Cardiovascular:     Rate and Rhythm: Normal rate and regular rhythm.     Pulses: Normal pulses.     Heart sounds: Normal heart sounds.  Pulmonary:     Effort: Pulmonary effort is normal.     Breath sounds: Normal breath sounds.  Abdominal:     Palpations:  Abdomen is soft.  Musculoskeletal:        General: Normal range of motion.     Cervical back: Normal range of motion and neck supple.  Lymphadenopathy:     Cervical: No cervical adenopathy.  Skin:    General: Skin is warm and dry.     Capillary Refill: Capillary refill takes less than 2 seconds.  Neurological:     General: No focal deficit present.     Mental Status: She is alert and oriented to person, place, and time.  Psychiatric:        Mood and Affect: Mood normal.        Behavior: Behavior normal.        Thought Content: Thought content normal.        Judgment: Judgment normal.      Assessment & Plan    Essential hypertension Assessment & Plan: Stable. Continue current medication.    Adult attention deficit hyperactivity disorder Assessment & Plan: Doing well with current dose adderall.  -continue twice daily as needed  -three 30 day prescriptions were sent to her pharmacy   Orders: -     Amphetamine-Dextroamphetamine; Take 1 tablet by mouth 2 (two) times daily as needed.  Dispense: 60 tablet; Refill: 0  Multiple sclerosis, relapsing-remitting (HCC) Assessment & Plan: Currently well managed. Continue regular visits with neurology as scheduled.       Return in about 3 months (around 05/20/2023) for blood pressure, med refills.         Carlean Jews, NP  [facility] at [facility] [phone] (phone) [phone] (fax)  [facility]

## 2023-03-20 NOTE — Assessment & Plan Note (Signed)
Currently well managed. Continue regular visits with neurology as scheduled.

## 2023-03-20 NOTE — Assessment & Plan Note (Signed)
Doing well with current dose adderall.  -continue twice daily as needed  -three 30 day prescriptions were sent to her pharmacy

## 2023-03-20 NOTE — Assessment & Plan Note (Signed)
Stable.  Continue current medication

## 2023-05-11 ENCOUNTER — Telehealth: Payer: Self-pay | Admitting: *Deleted

## 2023-05-11 NOTE — Telephone Encounter (Signed)
LVM for pt to call office to get appointment on 7/3 rescheduled due to provider not being here anymore.  Will also send [employer].

## 2023-05-13 IMAGING — MR MR HEAD WO/W CM
14 series · 45 of 48 positions shown · IV contrast (gadavist)
Comparison: None Available.

CLINICAL DATA: Multiple sclerosis, double vision, imbalance,
blurred vision, dizziness

EXAM:
MRI HEAD WITHOUT AND WITH CONTRAST
TECHNIQUE: Multiplanar, multiecho pulse sequences of the brain and surrounding
structures were obtained without and with intravenous contrast.
CONTRAST:  8mL GADAVIST GADOBUTROL 1 MMOL/ML IV SOLN

[Series 5: ax dwi_tracew · axial · 3.0mm · 0.60mm/px · z∈[-153,-5]mm · 4 of 48 slices shown]
[im 1/48]
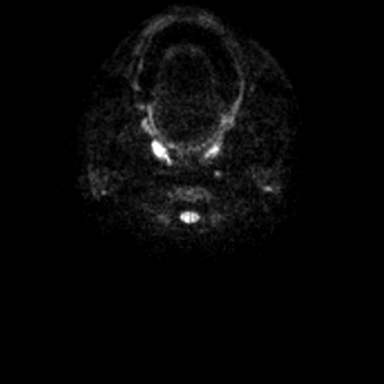
[im 16/48]
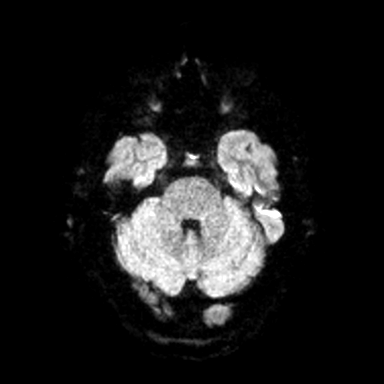
[im 32/48]
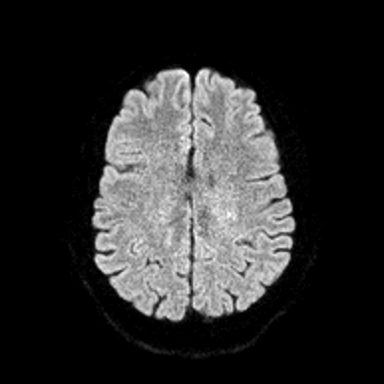
[im 48/48]
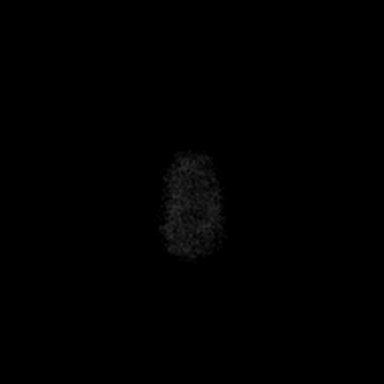

[Series 6: ax dwi_adc · axial · 3.0mm · 0.60mm/px · z∈[-153,-5]mm · 4 of 48 slices shown]
[im 1/48]
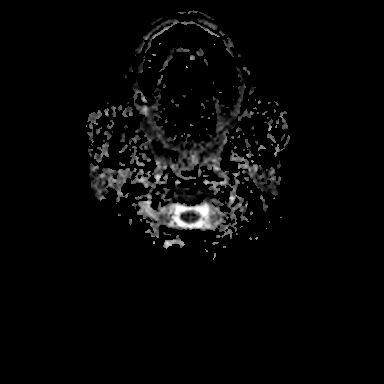
[im 16/48]
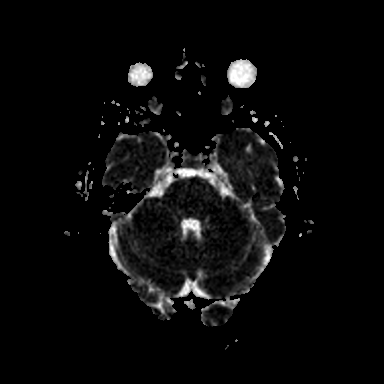
[im 32/48]
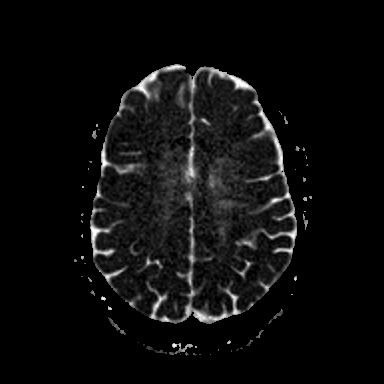
[im 48/48]
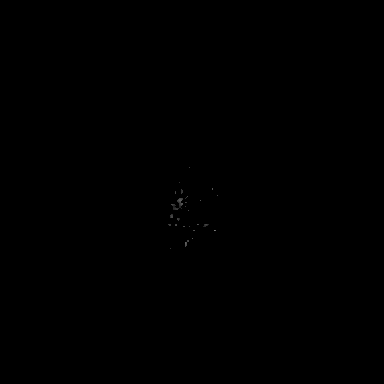

[Series 7: cor dwi_tracew · coronal · 5.0mm · 0.60mm/px · 2 of 38 slices shown]
[im 1/38]
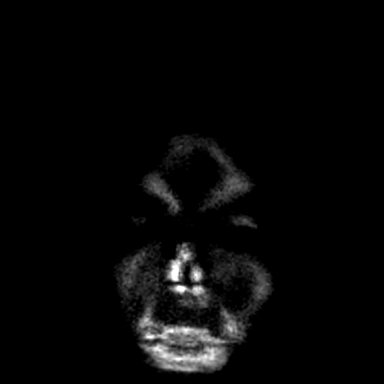
[im 38/38]
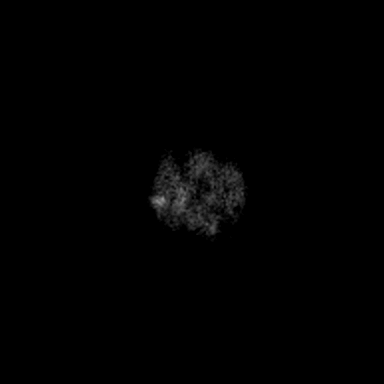

[Series 8: cor dwi_adc · coronal · 5.0mm · 0.60mm/px · 2 of 38 slices shown]
[im 1/38]
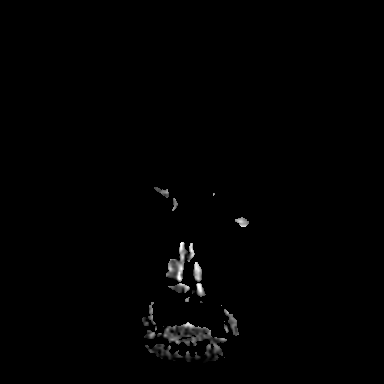
[im 38/38]
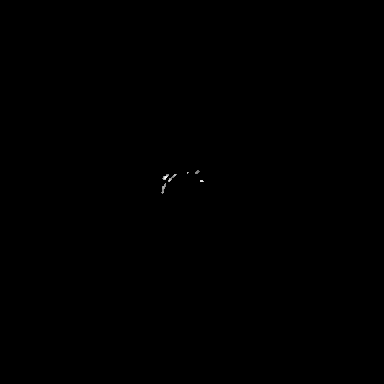

[Series 9: T1 · sagittal · 5.0mm · 0.62mm/px · 1 of 21 slices shown (1 of 2)]
[im 1/21]
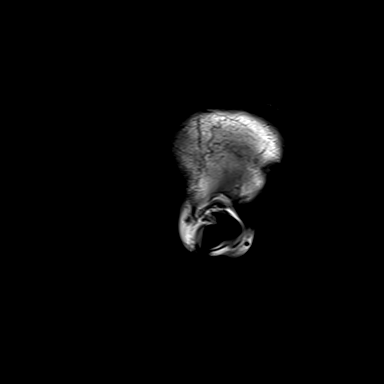

[Series 10: FLAIR · sagittal · 5.0mm · 0.94mm/px · 1 of 21 slices shown (1 of 2)]
[im 1/21]
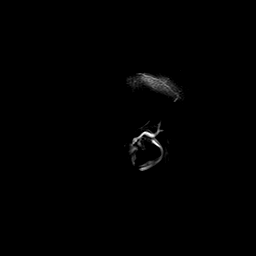

[Series 11: T2 · axial · 5.0mm · 0.53mm/px · z∈[-146,-9]mm · 2 of 25 slices shown]
[im 1/25]
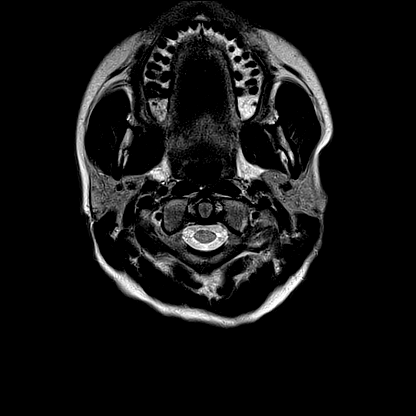
[im 25/25]
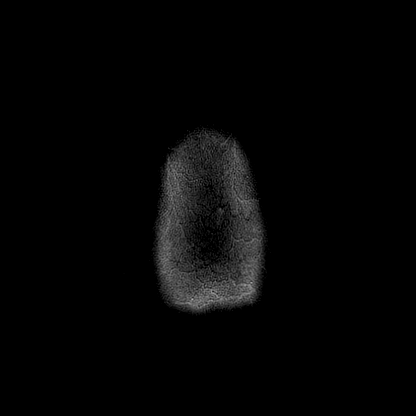

[Series 13: pha_images · axial · 3.0mm · 0.90mm/px · z∈[-151,-6]mm · 3 of 52 slices shown]
[im 1/52]
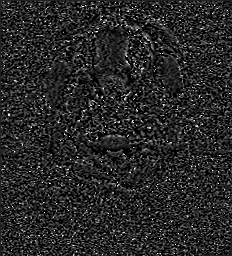
[im 26/52]
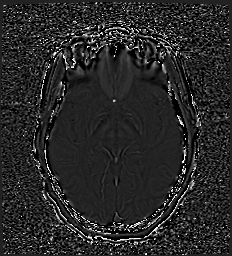
[im 52/52]
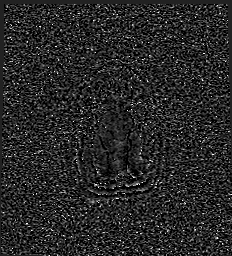

[Series 14: swi_images · axial · 3.0mm · 0.90mm/px · 1 of 52 slices shown]
[im 1/52]
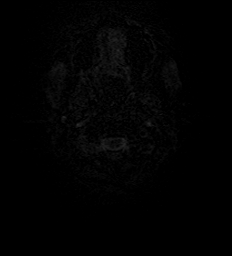

[Series 16: FLAIR · axial · 3.0mm · 0.53mm/px · z∈[-155,-0]mm · 4 of 55 slices shown (2 of 2)]
[im 1/55]
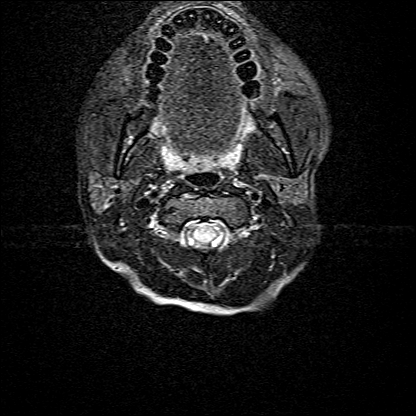
[im 19/55]
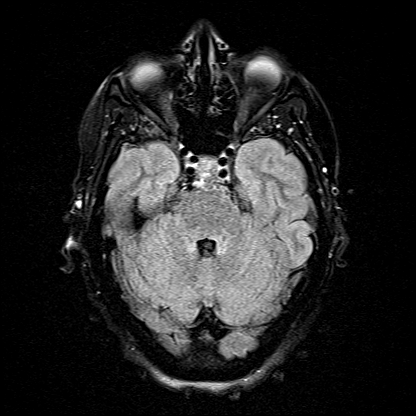
[im 37/55]
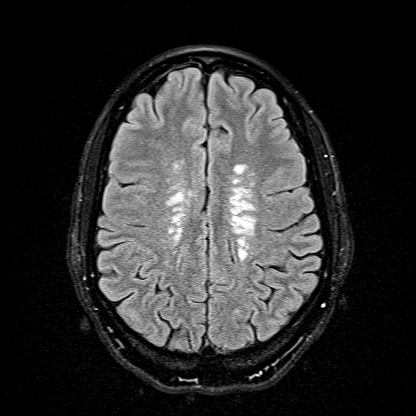
[im 55/55]
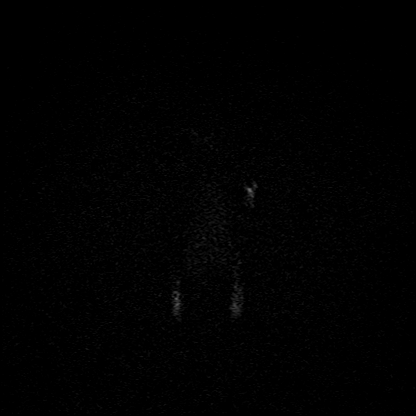

[Series 17: T1 · axial · 1.0mm · 0.98mm/px · z∈[-150,-14]mm · 8 of 144 slices shown (2 of 2)]
[im 1/144]
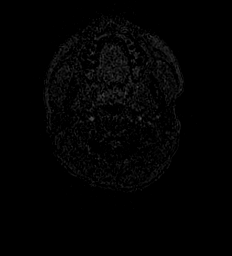
[im 18/144]
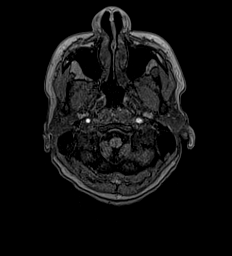
[im 36/144]
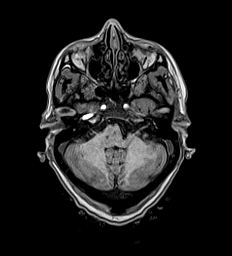
[im 54/144]
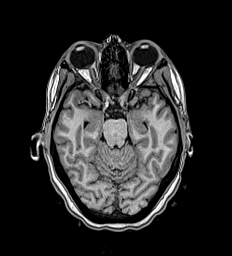
[im 90/144]
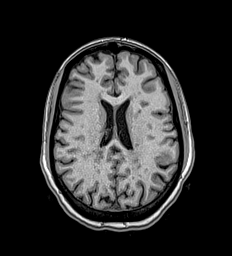
[im 108/144]
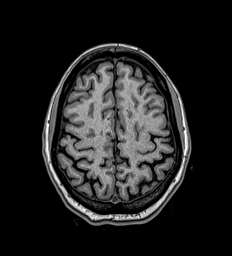
[im 126/144]
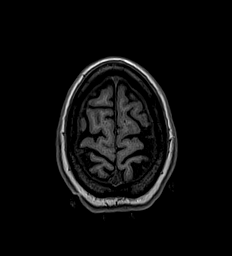
[im 144/144]
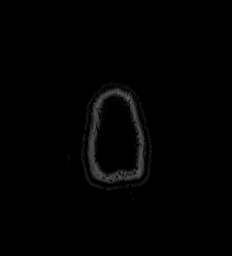

[Series 18: T2 post-contrast · coronal · 5.0mm · 0.57mm/px · 2 of 29 slices shown]
[im 1/29]
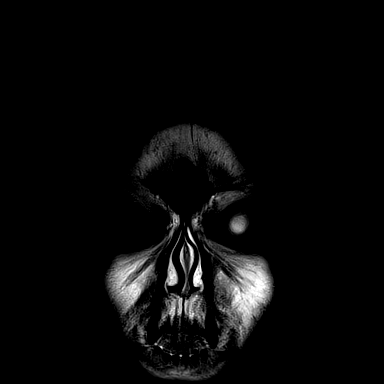
[im 29/29]
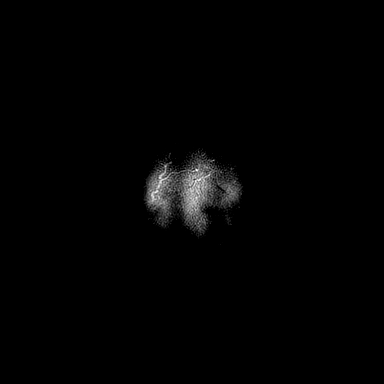

[Series 19: T1 post-contrast · axial · 1.0mm · 0.98mm/px · z∈[-150,-14]mm · 9 of 144 slices shown (1 of 2)]
[im 1/144]
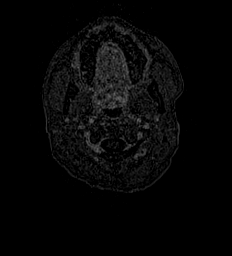
[im 18/144]
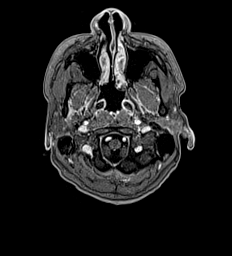
[im 36/144]
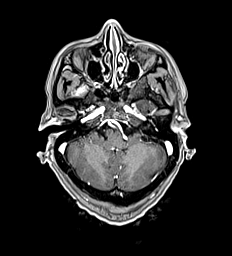
[im 54/144]
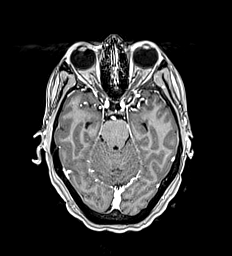
[im 72/144]
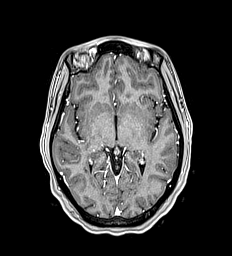
[im 90/144]
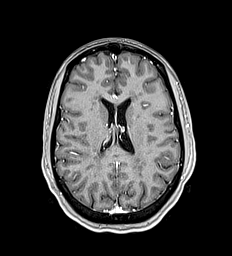
[im 108/144]
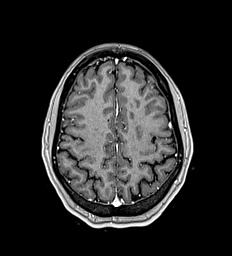
[im 126/144]
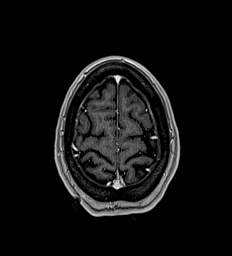
[im 144/144]
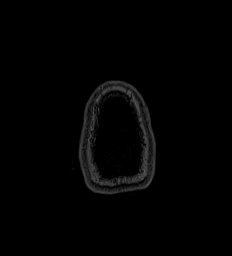

[Series 20: T1 post-contrast · coronal · 5.0mm · 0.57mm/px · 2 of 29 slices shown (2 of 2)]
[im 1/29]
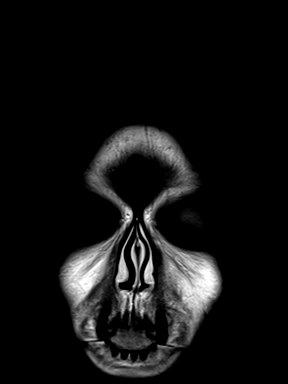
[im 29/29]
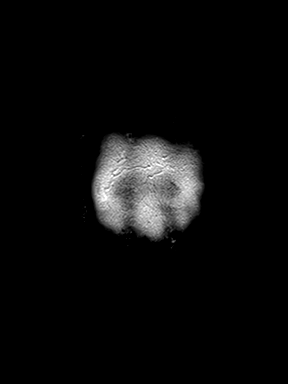

[45 of 48 positions shown; findings below may reference images not displayed]

FINDINGS: Brain: Patchy and mildly confluent areas of T2 hyperintensity in the
periventricular much greater than subcortical supratentorial white
matter. Much more subtle involvement of the posterior fossa. No
cavitary "black hole" lesions. No abnormal enhancement. No
significant volume loss.

There is no acute infarction or intracranial hemorrhage. There is no
intracranial mass, mass effect, or edema. There is no hydrocephalus
or extra-axial fluid collection.

Vascular: Major vessel flow voids at the skull base are preserved.

Skull and upper cervical spine: Normal marrow signal is preserved.

Sinuses/Orbits: Paranasal sinuses are aerated. Orbits are
unremarkable.

Other: Sella is unremarkable.  Mastoid air cells are clear.
IMPRESSION: Abnormal signal involving the supratentorial white matter and to a
much lesser extent the posterior fossa consistent with history of
multiple sclerosis. There is no enhancement to suggest active
demyelination. No evidence of adverse treatment effect.

## 2023-05-20 ENCOUNTER — Ambulatory Visit: Payer: BC Managed Care – PPO | Admitting: Nurse Practitioner

## 2023-05-26 DIAGNOSIS — Z5112 Encounter for antineoplastic immunotherapy: Secondary | ICD-10-CM | POA: Diagnosis not present

## 2023-05-26 DIAGNOSIS — G35 Multiple sclerosis: Secondary | ICD-10-CM | POA: Diagnosis not present

## 2023-06-22 ENCOUNTER — Ambulatory Visit: Payer: BC Managed Care – PPO | Admitting: Family Medicine

## 2023-06-24 ENCOUNTER — Encounter: Payer: Self-pay | Admitting: Family Medicine

## 2023-06-24 ENCOUNTER — Ambulatory Visit (INDEPENDENT_AMBULATORY_CARE_PROVIDER_SITE_OTHER): Payer: BC Managed Care – PPO | Admitting: Family Medicine

## 2023-06-24 VITALS — BP 113/79 | HR 94 | Ht 64.36 in | Wt 186.8 lb

## 2023-06-24 DIAGNOSIS — I10 Essential (primary) hypertension: Secondary | ICD-10-CM | POA: Diagnosis not present

## 2023-06-24 DIAGNOSIS — F909 Attention-deficit hyperactivity disorder, unspecified type: Secondary | ICD-10-CM

## 2023-06-24 DIAGNOSIS — G43709 Chronic migraine without aura, not intractable, without status migrainosus: Secondary | ICD-10-CM

## 2023-06-24 MED ORDER — AMPHETAMINE-DEXTROAMPHETAMINE 15 MG PO TABS: 15.0000 mg | ORAL_TABLET | Freq: Two times a day (BID) | ORAL | 0 refills | Status: DC | PRN

## 2023-06-24 NOTE — Assessment & Plan Note (Signed)
No complaints.  Tolerating medicine well.  Sending new prescription with additional refills for 90 days.

## 2023-06-24 NOTE — Assessment & Plan Note (Signed)
Continue taking Pamelor at night.  Well-controlled.

## 2023-06-24 NOTE — Assessment & Plan Note (Signed)
Normal blood pressure today.  Continue atenolol.

## 2023-06-24 NOTE — Progress Notes (Signed)
   Established Patient Office Visit  Subjective   Patient ID: Brandy Krause, female    DOB: 05/16/1986  Age: 37 y.o. MRN: [id number]  Chief Complaint  Patient presents with   Medical Management of Chronic Issues    HPI  ADHD-the patient has no questions or concerns regarding her medication.  Has been on it for about a year.  Works well for her.  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  Patient takes Pamelor for migraines.  No issues with this.  Working well.  Patient takes atenolol for blood pressure.  Has no questions or concerns.  Tolerates medicine well.   The ASCVD Risk score (Arnett DK, et al., 2019) failed to calculate for the following reasons:   The 2019 ASCVD risk score is only valid for ages 21 to 34  Health Maintenance Due  Topic Date Due   DTaP/Tdap/Td (3 - Tdap) 07/14/2006   INFLUENZA VACCINE  06/18/2023      Objective:     BP 113/79   Pulse 94   Ht 5' 4.36" (1.635 m)   Wt 186 lb 12.8 oz (84.7 kg)   LMP 06/10/2023   SpO2 96%   BMI 31.71 kg/m    Physical Exam General: Alert, oriented Pulmonary: No respiratory distress Psych: Pleasant affect, spontaneous speech, good eye contact.   No results found for any visits on 06/24/23.      Assessment & Plan:   Adult attention deficit hyperactivity disorder Assessment & Plan: No complaints.  Tolerating medicine well.  Sending new prescription with additional refills for 90 days.  Orders: -     Amphetamine-Dextroamphetamine; Take 1 tablet by mouth 2 (two) times daily as needed.  Dispense: 60 tablet; Refill: 0 -     Amphetamine-Dextroamphetamine; Take 1 tablet by mouth 2 (two) times daily as needed.  Dispense: 60 tablet; Refill: 0 -     Amphetamine-Dextroamphetamine; Take 1 tablet by mouth 2 (two) times daily as needed.  Dispense: 60 tablet; Refill: 0  Chronic migraine without aura without status migrainosus, not intractable Assessment & Plan: Continue taking Pamelor at  night.  Well-controlled.   Essential hypertension Assessment & Plan: Normal blood pressure today.  Continue atenolol.      Return in about 6 months (around 12/25/2023) for add.    Sandre Kitty, MD

## 2023-06-24 NOTE — Patient Instructions (Signed)
It was nice to see you today,  We addressed the following topics today: - I have sent in 3 months supply of adderall.   - I will see you again in 6 months. You can call in a refill when you need one in three months.  Have a great day,  Frederic Jericho, MD

## 2023-08-25 ENCOUNTER — Other Ambulatory Visit: Payer: Self-pay | Admitting: Nurse Practitioner

## 2023-08-25 DIAGNOSIS — I10 Essential (primary) hypertension: Secondary | ICD-10-CM

## 2023-08-25 DIAGNOSIS — E559 Vitamin D deficiency, unspecified: Secondary | ICD-10-CM

## 2023-08-25 NOTE — Telephone Encounter (Signed)
The medication has always been written as once daily.  That is how it is typically given.  I do not see where she was ever told to take atenolol twice a day.  I will send it in as half a tablet daily like it is written in the request, but I would like to see her back in the office to talk about her medication and recheck her vitamin D sometime in the next 1 to 2 months if possible.

## 2023-08-25 NOTE — Telephone Encounter (Signed)
Can we call the patient to confirm if it's 1 tablet or a half tablet? Most recent prescriptions have been for a full tablet.  Also can we find out when the last time she took both of these medications?

## 2023-08-26 ENCOUNTER — Other Ambulatory Visit: Payer: Self-pay | Admitting: Family Medicine

## 2023-08-26 DIAGNOSIS — E559 Vitamin D deficiency, unspecified: Secondary | ICD-10-CM

## 2023-08-26 NOTE — Telephone Encounter (Signed)
Pt is going to come in Nov 1 for labs and return on 09/25/23 for an office visit

## 2023-09-18 ENCOUNTER — Other Ambulatory Visit: Payer: BC Managed Care – PPO

## 2023-09-18 DIAGNOSIS — E559 Vitamin D deficiency, unspecified: Secondary | ICD-10-CM

## 2023-09-19 LAB — VITAMIN D 25 HYDROXY (VIT D DEFICIENCY, FRACTURES): Vit D, 25-Hydroxy: 21.5 ng/mL — ABNORMAL LOW (ref 30.0–100.0)

## 2023-09-25 ENCOUNTER — Ambulatory Visit (INDEPENDENT_AMBULATORY_CARE_PROVIDER_SITE_OTHER): Payer: BC Managed Care – PPO | Admitting: Family Medicine

## 2023-09-25 ENCOUNTER — Encounter: Payer: Self-pay | Admitting: Family Medicine

## 2023-09-25 VITALS — BP 128/87 | HR 92 | Ht 64.0 in | Wt 192.4 lb

## 2023-09-25 DIAGNOSIS — F909 Attention-deficit hyperactivity disorder, unspecified type: Secondary | ICD-10-CM

## 2023-09-25 DIAGNOSIS — I10 Essential (primary) hypertension: Secondary | ICD-10-CM | POA: Diagnosis not present

## 2023-09-25 DIAGNOSIS — E559 Vitamin D deficiency, unspecified: Secondary | ICD-10-CM

## 2023-09-25 MED ORDER — VITAMIN D (ERGOCALCIFEROL) 1.25 MG (50000 UNIT) PO CAPS: 50000.0000 [IU] | ORAL_CAPSULE | ORAL | 0 refills | Status: DC

## 2023-09-25 MED ORDER — AMPHETAMINE-DEXTROAMPHETAMINE 20 MG PO TABS: 20.0000 mg | ORAL_TABLET | Freq: Two times a day (BID) | ORAL | 0 refills | Status: DC

## 2023-09-25 MED ORDER — AMPHETAMINE-DEXTROAMPHETAMINE 20 MG PO TABS
20.0000 mg | ORAL_TABLET | Freq: Two times a day (BID) | ORAL | 0 refills | Status: DC
Start: 2023-10-25 — End: 2023-12-28

## 2023-09-25 NOTE — Assessment & Plan Note (Signed)
 Continue atenolol

## 2023-09-25 NOTE — Assessment & Plan Note (Signed)
Increasing from 15 mg to 20 mg.  Follow-up in 3 months.

## 2023-09-25 NOTE — Progress Notes (Signed)
   Established Patient Office Visit  Subjective   Patient ID: Brandy Krause, female    DOB: June 13, 1986  Age: 37 y.o. MRN: [id number]  Chief Complaint  Patient presents with   Medical Management of Chronic Issues    HPI  ADHD -patient feels like her current dose of Adderall is not as effective.  Feels like it is wearing off sooner as well.  She has started to increase it herself by adding additional third of a tablet to try and approximately 20 mg at home.  Feels like this is working better.  Vitamin D deficiency-discussed continuing to take weekly vitamin D until I see her again in 3 months.  Patient has no other concerns or complaints.      The ASCVD Risk score (Arnett DK, et al., 2019) failed to calculate for the following reasons:   The 2019 ASCVD risk score is only valid for ages 36 to 60  Health Maintenance Due  Topic Date Due   DTaP/Tdap/Td (3 - Tdap) 07/14/2006      Objective:     BP 128/87   Pulse 92   Ht 5\' 4"  (1.626 m)   Wt 192 lb 6.4 oz (87.3 kg)   LMP 09/21/2023   SpO2 100%   BMI 33.03 kg/m    Physical Exam General: Alert, oriented Pulmonary: No respiratory distress Psych: Pleasant affect, spontaneous speech, good eye contact.   No results found for any visits on 09/25/23.      Assessment & Plan:   Adult attention deficit hyperactivity disorder Assessment & Plan: Increasing from 15 mg to 20 mg.  Follow-up in 3 months.   Vitamin D deficiency Assessment & Plan: Continue 12 more weeks of vitamin D weekly.  Recheck prior to visit in 3 months.  Orders: -     Vitamin D (Ergocalciferol); Take 1 capsule (50,000 Units total) by mouth every 7 (seven) days.  Dispense: 12 capsule; Refill: 0  Essential hypertension Assessment & Plan: Continue atenolol   Other orders -     Amphetamine-Dextroamphetamine; Take 1 tablet (20 mg total) by mouth 2 (two) times daily.  Dispense: 60 tablet; Refill: 0 -     Amphetamine-Dextroamphetamine; Take 1 tablet (20  mg total) by mouth 2 (two) times daily.  Dispense: 60 tablet; Refill: 0 -     Amphetamine-Dextroamphetamine; Take 1 tablet (20 mg total) by mouth 2 (two) times daily.  Dispense: 60 tablet; Refill: 0     Return in about 3 months (around 12/26/2023) for physical.    Sandre Kitty, MD

## 2023-09-25 NOTE — Assessment & Plan Note (Signed)
Continue 12 more weeks of vitamin D weekly.  Recheck prior to visit in 3 months.

## 2023-09-25 NOTE — Patient Instructions (Signed)
It was nice to see you today,  We addressed the following topics today: -I have increased your Adderall to 20 mg - I have resent in vitamin D for weekly dosing until I see you again in 3 months - Prior to your visit in 3 months we can do yearly labs.  Have a great day,  Frederic Jericho, MD

## 2023-12-07 ENCOUNTER — Other Ambulatory Visit: Payer: Self-pay

## 2023-12-07 DIAGNOSIS — R5383 Other fatigue: Secondary | ICD-10-CM

## 2023-12-07 DIAGNOSIS — Z0000 Encounter for general adult medical examination without abnormal findings: Secondary | ICD-10-CM

## 2023-12-07 DIAGNOSIS — I10 Essential (primary) hypertension: Secondary | ICD-10-CM

## 2023-12-21 ENCOUNTER — Other Ambulatory Visit: Payer: BC Managed Care – PPO

## 2023-12-25 ENCOUNTER — Ambulatory Visit: Payer: BC Managed Care – PPO | Admitting: Family Medicine

## 2023-12-25 NOTE — Progress Notes (Deleted)
   Established Patient Office Visit  Subjective   Patient ID: Brandy Krause, female    DOB: 01-11-1986  Age: 38 y.o. MRN: [id number]  No chief complaint on file.   HPI  ADHD-last filled on 11/8.  At that time we increased it from 15-20  Did not get labs on 1/20  Vitamin D  deficiency-need to repeat vitamin D  level.  Blood pressure-atenolol    The ASCVD Risk score (Arnett DK, et al., 2019) failed to calculate for the following reasons:   The 2019 ASCVD risk score is only valid for ages 20 to 32  Health Maintenance Due  Topic Date Due   DTaP/Tdap/Td (3 - Tdap) 07/14/2006      Objective:     There were no vitals taken for this visit. {Vitals History (Optional):23777}  Physical Exam   No results found for any visits on 12/25/23.      Assessment & Plan:   There are no diagnoses linked to this encounter.   No follow-ups on file.    Brandy [name], MD

## 2023-12-28 ENCOUNTER — Ambulatory Visit (INDEPENDENT_AMBULATORY_CARE_PROVIDER_SITE_OTHER): Payer: BC Managed Care – PPO | Admitting: Family Medicine

## 2023-12-28 ENCOUNTER — Encounter: Payer: Self-pay | Admitting: Family Medicine

## 2023-12-28 VITALS — BP 134/87 | HR 82 | Ht 64.0 in | Wt 193.1 lb

## 2023-12-28 DIAGNOSIS — Z0000 Encounter for general adult medical examination without abnormal findings: Secondary | ICD-10-CM

## 2023-12-28 DIAGNOSIS — I10 Essential (primary) hypertension: Secondary | ICD-10-CM | POA: Diagnosis not present

## 2023-12-28 DIAGNOSIS — E559 Vitamin D deficiency, unspecified: Secondary | ICD-10-CM

## 2023-12-28 DIAGNOSIS — Z6830 Body mass index (BMI) 30.0-30.9, adult: Secondary | ICD-10-CM

## 2023-12-28 DIAGNOSIS — F909 Attention-deficit hyperactivity disorder, unspecified type: Secondary | ICD-10-CM | POA: Diagnosis not present

## 2023-12-28 DIAGNOSIS — G43709 Chronic migraine without aura, not intractable, without status migrainosus: Secondary | ICD-10-CM

## 2023-12-28 MED ORDER — AMPHETAMINE-DEXTROAMPHETAMINE 20 MG PO TABS: 20.0000 mg | ORAL_TABLET | Freq: Two times a day (BID) | ORAL | 0 refills | Status: DC

## 2023-12-28 NOTE — Assessment & Plan Note (Signed)
 Continue atenolol .  At goal today.

## 2023-12-28 NOTE — Assessment & Plan Note (Signed)
 Will recheck vitamin D  level today.  If at goal we will switch to daily vitamin D .  If not continue weekly and recheck in 3 months

## 2023-12-28 NOTE — Assessment & Plan Note (Signed)
 Patient is not taking her nortriptyline anymore.  No issues with headaches.  She attributed to stress.

## 2023-12-28 NOTE — Progress Notes (Signed)
   Established Patient Office Visit  Subjective   Patient ID: Brandy Krause, female    DOB: March 01, 1986  Age: 38 y.o. MRN: [id number]  Chief Complaint  Patient presents with   Annual Exam    HPI  Vitamin D deficiency-patient okay with rechecking vitamin D to level today.  We discussed daily versus weekly vitamin D after getting results  ADHD-patient taking her 20 mg dose and has not had any issues with it.  We discussed doing a follow-up in 3 months and then extended to 6 months afterwards if she is stable.  Hypertension-patient still seeing her Tylenol.  No issues with this.  Headaches-patient no longer having headaches.  I will order taking her nortriptyline.  She attributes the headaches to stress.  Patient does not have any alcohol or tobacco use.  Patient is not recently sexually active.  Gets regular menstrual cycles.  Not on birth control.  The ASCVD Risk score (Arnett DK, et al., 2019) failed to calculate for the following reasons:   The 2019 ASCVD risk score is only valid for ages 53 to 62  Health Maintenance Due  Topic Date Due   DTaP/Tdap/Td (3 - Tdap) 07/14/2006      Objective:     BP 134/87   Pulse 82   Ht 5\' 4"  (1.626 m)   Wt 193 lb 1.9 oz (87.6 kg)   LMP 12/03/2023   SpO2 100%   BMI 33.15 kg/m    Physical Exam General Hide alert, oriented CV: Regular rate and rhythm Camera: Lungs clear bilaterally Psych: Pleasant affect.      Assessment & Plan:   Physical exam, annual  Vitamin D deficiency Assessment & Plan: Will recheck vitamin D level today.  If at goal we will switch to daily vitamin D.  If not continue weekly and recheck in 3 months  Orders: -     VITAMIN D 25 Hydroxy (Vit-D Deficiency, Fractures)  Essential hypertension Assessment & Plan: Continue atenolol.  At goal today.  Orders: -     Hemoglobin A1c -     Comprehensive metabolic panel  Wellness examination -     Hemoglobin A1c -     Comprehensive metabolic panel -      Lipid panel  Adult attention deficit hyperactivity disorder Assessment & Plan: Tolerating 20 mg fine.  Will see again in 3 months and if stable will start following up every 6 months.  Orders: -     Amphetamine-Dextroamphetamine; Take 1 tablet (20 mg total) by mouth 2 (two) times daily.  Dispense: 60 tablet; Refill: 0 -     Amphetamine-Dextroamphetamine; Take 1 tablet (20 mg total) by mouth 2 (two) times daily.  Dispense: 60 tablet; Refill: 0 -     Amphetamine-Dextroamphetamine; Take 1 tablet (20 mg total) by mouth 2 (two) times daily.  Dispense: 60 tablet; Refill: 0  Chronic migraine without aura without status migrainosus, not intractable Assessment & Plan: Patient is not taking her nortriptyline anymore.  No issues with headaches.  She attributed to stress.   Body mass index (BMI) of 30.0-30.9 in adult -     Hemoglobin A1c -     Comprehensive metabolic panel -     Lipid panel     Return in about 3 months (around 03/26/2024) for add.    Sandre Kitty, MD

## 2023-12-28 NOTE — Patient Instructions (Addendum)
 It was nice to see you today,  We addressed the following topics today: -I am going to recheck your vitamin D  level today.  Based on that we may switch to daily dosing - I am sending in refills of your medication.  I will follow-up with you in 3 months.  If you are stable on that medication after 3 months we can extend your visit to 6 months. -  Have a great day,  Etha Henle, MD

## 2023-12-28 NOTE — Assessment & Plan Note (Signed)
 Tolerating 20 mg fine.  Will see again in 3 months and if stable will start following up every 6 months.

## 2023-12-29 ENCOUNTER — Encounter: Payer: Self-pay | Admitting: Family Medicine

## 2023-12-29 ENCOUNTER — Other Ambulatory Visit: Payer: Self-pay | Admitting: Family Medicine

## 2023-12-29 DIAGNOSIS — E559 Vitamin D deficiency, unspecified: Secondary | ICD-10-CM

## 2023-12-29 LAB — HEMOGLOBIN A1C
Est. average glucose Bld gHb Est-mCnc: 114 mg/dL
Hgb A1c MFr Bld: 5.6 % (ref 4.8–5.6)

## 2023-12-29 LAB — LIPID PANEL
Chol/HDL Ratio: 2.2 {ratio} (ref 0.0–4.4)
Cholesterol, Total: 190 mg/dL (ref 100–199)
HDL: 85 mg/dL (ref >39)
LDL Chol Calc (NIH): 98 mg/dL (ref 0–99)
Triglycerides: 32 mg/dL (ref 0–149)
VLDL Cholesterol Cal: 7 mg/dL (ref 5–40)

## 2023-12-29 LAB — COMPREHENSIVE METABOLIC PANEL
ALT: 14 [IU]/L (ref 0–32)
AST: 13 [IU]/L (ref 0–40)
Albumin: 4.3 g/dL (ref 3.9–4.9)
Alkaline Phosphatase: 61 [IU]/L (ref 44–121)
BUN/Creatinine Ratio: 21 (ref 9–23)
BUN: 17 mg/dL (ref 6–20)
Bilirubin Total: 0.3 mg/dL (ref 0.0–1.2)
CO2: 20 mmol/L (ref 20–29)
Calcium: 9.2 mg/dL (ref 8.7–10.2)
Chloride: 104 mmol/L (ref 96–106)
Creatinine, Ser: 0.81 mg/dL (ref 0.57–1.00)
Globulin, Total: 2.7 g/dL (ref 1.5–4.5)
Glucose: 83 mg/dL (ref 70–99)
Potassium: 4.1 mmol/L (ref 3.5–5.2)
Sodium: 139 mmol/L (ref 134–144)
Total Protein: 7 g/dL (ref 6.0–8.5)
eGFR: 96 mL/min/{1.73_m2} (ref >59)

## 2023-12-29 LAB — VITAMIN D 25 HYDROXY (VIT D DEFICIENCY, FRACTURES): Vit D, 25-Hydroxy: 19.7 ng/mL — ABNORMAL LOW (ref 30.0–100.0)

## 2023-12-29 MED ORDER — VITAMIN D (ERGOCALCIFEROL) 1.25 MG (50000 UNIT) PO CAPS: 50000.0000 [IU] | ORAL_CAPSULE | ORAL | 0 refills | Status: AC

## 2024-03-28 ENCOUNTER — Ambulatory Visit (INDEPENDENT_AMBULATORY_CARE_PROVIDER_SITE_OTHER): Payer: BC Managed Care – PPO | Admitting: Family Medicine

## 2024-03-28 ENCOUNTER — Encounter: Payer: Self-pay | Admitting: Family Medicine

## 2024-03-28 VITALS — BP 128/89 | HR 78 | Ht 64.0 in | Wt 190.6 lb

## 2024-03-28 DIAGNOSIS — I10 Essential (primary) hypertension: Secondary | ICD-10-CM

## 2024-03-28 DIAGNOSIS — E559 Vitamin D deficiency, unspecified: Secondary | ICD-10-CM

## 2024-03-28 DIAGNOSIS — F909 Attention-deficit hyperactivity disorder, unspecified type: Secondary | ICD-10-CM

## 2024-03-28 MED ORDER — AMPHETAMINE-DEXTROAMPHETAMINE 20 MG PO TABS
20.0000 mg | ORAL_TABLET | Freq: Two times a day (BID) | ORAL | 0 refills | Status: AC
Start: 2024-04-27 — End: ?

## 2024-03-28 MED ORDER — AMPHETAMINE-DEXTROAMPHETAMINE 20 MG PO TABS: 20.0000 mg | ORAL_TABLET | Freq: Two times a day (BID) | ORAL | 0 refills | Status: DC

## 2024-03-28 MED ORDER — AMPHETAMINE-DEXTROAMPHETAMINE 20 MG PO TABS: 20.0000 mg | ORAL_TABLET | Freq: Two times a day (BID) | ORAL | 0 refills | Status: AC

## 2024-03-28 NOTE — Patient Instructions (Signed)
 It was nice to see you today,  We addressed the following topics today: - I have resent in your medications - I will recheck your vit d level today.  If it is normal I would recommend taking over the counter vitamin d3 at 5000 IU daily   Have a great day,  Etha Henle, MD

## 2024-03-28 NOTE — Progress Notes (Signed)
   Established Patient Office Visit  Subjective   Patient ID: Brandy Krause, female    DOB: 09/29/1986  Age: 38 y.o. MRN: [id number]  Chief Complaint  Patient presents with   Medication Management    HPI  Subjective - Adderall 20mg  continues to be effective - Completed vitamin D  supplementation (weekly doses) - Not currently taking Atenolol  for blood pressure - Has home blood pressure monitoring device  Medications Adderall 20mg , vitamin D  (completed course)  PMH, PSH, FH, Social Hx Hypertension, vitamin D  deficiency  ROS Cardiovascular: denies issues with current Adderall dose   The ASCVD Risk score (Arnett DK, et al., 2019) failed to calculate for the following reasons:   The 2019 ASCVD risk score is only valid for ages 89 to 34  Health Maintenance Due  Topic Date Due   DTaP/Tdap/Td (3 - Tdap) 07/14/2006      Objective:     BP 128/89 (BP Location: Right Arm)   Pulse 78   Ht 5\' 4"  (1.626 m)   Wt 190 lb 9.6 oz (86.5 kg)   SpO2 97%   BMI 32.72 kg/m    Physical Exam General: Alert, oriented Pulmonary: No respiratory distress Psych: Pleasant affect   No results found for any visits on 03/28/24.      Assessment & Plan:   Vitamin D  deficiency Assessment & Plan: Rechecking vitamin D  today.  If normal will do vitamin D  daily over-the-counter.  Orders: -     VITAMIN D  25 Hydroxy (Vit-D Deficiency, Fractures)  Adult attention deficit hyperactivity disorder Assessment & Plan: No issues with medication.  Continue Adderall.  Follow-up 6 months.  Orders: -     Amphetamine -Dextroamphetamine ; Take 1 tablet (20 mg total) by mouth 2 (two) times daily.  Dispense: 60 tablet; Refill: 0 -     Amphetamine -Dextroamphetamine ; Take 1 tablet (20 mg total) by mouth 2 (two) times daily.  Dispense: 60 tablet; Refill: 0 -     Amphetamine -Dextroamphetamine ; Take 1 tablet (20 mg total) by mouth 2 (two) times daily.  Dispense: 60 tablet; Refill: 0  Essential  hypertension Assessment & Plan: Not taking atenolol .  Initial blood pressure reading was elevated but repeat was better.  Continue to monitor.      Return in about 6 months (around 09/28/2024), or add.    Laneta Pintos, MD

## 2024-03-28 NOTE — Assessment & Plan Note (Signed)
 No issues with medication.  Continue Adderall.  Follow-up 6 months.

## 2024-03-28 NOTE — Assessment & Plan Note (Signed)
 Rechecking vitamin D  today.  If normal will do vitamin D  daily over-the-counter.

## 2024-03-28 NOTE — Assessment & Plan Note (Signed)
 Not taking atenolol .  Initial blood pressure reading was elevated but repeat was better.  Continue to monitor.

## 2024-03-29 ENCOUNTER — Ambulatory Visit: Payer: Self-pay | Admitting: Family Medicine

## 2024-03-29 LAB — VITAMIN D 25 HYDROXY (VIT D DEFICIENCY, FRACTURES): Vit D, 25-Hydroxy: 31.8 ng/mL (ref 30.0–100.0)

## 2024-03-29 MED ORDER — VITAMIN D3 50 MCG (2000 UT) PO CAPS: 4000.0000 [IU] | ORAL_CAPSULE | Freq: Every day | ORAL | 5 refills | Status: AC

## 2024-04-14 ENCOUNTER — Emergency Department

## 2024-04-14 ENCOUNTER — Other Ambulatory Visit: Payer: Self-pay

## 2024-04-14 ENCOUNTER — Emergency Department
Admission: EM | Admit: 2024-04-14 | Discharge: 2024-04-14 | Disposition: A | Discharge status: Home / Self Care | Attending: Emergency Medicine | Admitting: Emergency Medicine

## 2024-04-14 DIAGNOSIS — G44209 Tension-type headache, unspecified, not intractable: Secondary | ICD-10-CM | POA: Diagnosis not present

## 2024-04-14 DIAGNOSIS — G43809 Other migraine, not intractable, without status migrainosus: Secondary | ICD-10-CM

## 2024-04-14 DIAGNOSIS — R519 Headache, unspecified: Secondary | ICD-10-CM | POA: Diagnosis present

## 2024-04-14 MED ORDER — RIZATRIPTAN BENZOATE 5 MG PO TABS
5.0000 mg | ORAL_TABLET | Freq: Once | ORAL | 0 refills | Status: AC | PRN
Start: 2024-04-14 — End: 2025-04-15

## 2024-04-14 MED ORDER — KETOROLAC TROMETHAMINE 30 MG/ML IJ SOLN
30.0000 mg | Freq: Once | INTRAMUSCULAR | Status: AC
  Administered 2024-04-14: 30 mg via INTRAMUSCULAR
  Filled 2024-04-14: qty 1

## 2024-04-14 MED ORDER — PROMETHAZINE HCL 25 MG/ML IJ SOLN
25.0000 mg | Freq: Once | INTRAMUSCULAR | Status: AC
  Administered 2024-04-14: 25 mg via INTRAMUSCULAR
  Filled 2024-04-14: qty 1

## 2024-04-14 MED ORDER — SUMATRIPTAN SUCCINATE 6 MG/0.5ML ~~LOC~~ SOLN
6.0000 mg | Freq: Once | SUBCUTANEOUS | Status: AC
  Administered 2024-04-14: 6 mg via SUBCUTANEOUS
  Filled 2024-04-14: qty 0.5

## 2024-04-14 MED ORDER — BUTALBITAL-APAP-CAFFEINE 50-325-40 MG PO TABS
1.0000 | ORAL_TABLET | Freq: Four times a day (QID) | ORAL | 0 refills | Status: AC | PRN
Start: 2024-04-14 — End: 2025-04-14

## 2024-04-14 MED ORDER — DIPHENHYDRAMINE HCL 25 MG PO CAPS
50.0000 mg | ORAL_CAPSULE | Freq: Once | ORAL | Status: AC
  Administered 2024-04-14: 50 mg via ORAL
  Filled 2024-04-14: qty 2

## 2024-04-14 NOTE — ED Provider Notes (Signed)
 [facility] Provider Note  Patient Contact: 2:23 PM (approximate)   History   Headache   HPI  Brandy Krause is a 38 y.o. female who presents to the urgency department complaining of a headache.  Patient has had a headache for over 24 hours, states that she has a history of undiagnosed migraines but has been on prescription medications in the past.  These headaches are typically in the frontal portion of her brain.  Patient has developed a posterior headache and feels like she has "tightness" in her neck.  There is no fevers, URI symptoms.  Patient denies any trauma to the head or neck.  Patient has taken over-the-counter medications without relief of symptoms.  No visual changes, unilateral weakness, slurred speech.  Patient does not take any prescription abortive or preventive medications for headache at this time.     Physical Exam   Triage Vital Signs: ED Triage Vitals [04/14/24 1304]  Encounter Vitals Group     BP (!) 149/86     Systolic BP Percentile      Diastolic BP Percentile      Pulse Rate (!) 115     Resp 20     Temp 97.8 F (36.6 C)     Temp Source Oral     SpO2 100 %     Weight 190 lb (86.2 kg)     Height 5\' 5"  (1.651 m)     Head Circumference      Peak Flow      Pain Score 10     Pain Loc      Pain Education      Exclude from Growth Chart     Most recent vital signs: Vitals:   04/14/24 1304  BP: (!) 149/86  Pulse: (!) 115  Resp: 20  Temp: 97.8 F (36.6 C)  SpO2: 100%     General: Alert and in no acute distress. Eyes:  PERRL. EOMI. Head: No acute traumatic findings  Neck: No stridor. No cervical spine tenderness to palpation.  Cardiovascular:  Good peripheral perfusion Respiratory: Normal respiratory effort without tachypnea or retractions. Lungs CTAB. Good air entry to the bases with no decreased or absent breath sounds Musculoskeletal: Full range of motion to all extremities.  Neurologic:  No gross focal neurologic  deficits are appreciated.  Cranial nerves II through XII grossly intact.  Negative Romberg's and pronator drift. Skin:   No rash noted Other:   ED Results / Procedures / Treatments   Labs (all labs ordered are listed, but only abnormal results are displayed) Labs Reviewed - No data to display   EKG     RADIOLOGY  I personally viewed, evaluated, and interpreted these images as part of my medical decision making, as well as reviewing the written report by the radiologist.  ED Provider Interpretation: No acute intracranial finding on CT scan of the head  CT Head Wo Contrast Result Date: 04/14/2024 CLINICAL DATA:  Headache, increasing frequency or severity. EXAM: CT HEAD WITHOUT CONTRAST TECHNIQUE: Contiguous axial images were obtained from the base of the skull through the vertex without intravenous contrast. RADIATION DOSE REDUCTION: This exam was performed according to the departmental dose-optimization program which includes automated exposure control, adjustment of the mA and/or kV according to patient size and/or use of iterative reconstruction technique. COMPARISON:  MRI head 04/03/2022. FINDINGS: Brain: No acute intracranial hemorrhage. No CT evidence of acute infarct. No edema, mass effect, or midline shift. The basilar cisterns are patent.  Ventricles: The ventricles are normal. Vascular: No hyperdense vessel or unexpected calcification. Skull: No acute or aggressive finding. Orbits: Orbits are symmetric. Sinuses: The visualized paranasal sinuses are clear. Other: Mastoid air cells are clear. IMPRESSION: No CT evidence of acute intracranial abnormality. Electronically Signed   By: Denny Flack M.D.   On: 04/14/2024 16:25    PROCEDURES:  Critical Care performed: No  Procedures   MEDICATIONS ORDERED IN ED: Medications  promethazine  (PHENERGAN ) injection 25 mg (25 mg Intramuscular Given 04/14/24 1711)  ketorolac  (TORADOL ) 30 MG/ML injection 30 mg (30 mg Intramuscular Given  04/14/24 1702)  SUMAtriptan  (IMITREX ) injection 6 mg (6 mg Subcutaneous Given 04/14/24 1702)  diphenhydrAMINE  (BENADRYL ) capsule 50 mg (50 mg Oral Given 04/14/24 1702)     IMPRESSION / MDM / ASSESSMENT AND PLAN / ED COURSE  I reviewed the triage vital signs and the nursing notes.                                 Differential diagnosis includes, but is not limited to, tension type headache, migraine headache, intracranial hemorrhage, intracranial mass   Patient's presentation is most consistent with acute presentation with potential threat to life or bodily function.   Patient's diagnosis is consistent with tension type headache and migraine.  Patient presents to the emergency department with a an atypical headache that she describes as one of the worst that she has had.  She has a history of migraines that its never been officially diagnosed.  She typically treats her headaches with Tylenol  and Motrin.  This was not alleviating patient's symptoms that she presents to the ED.  She was neurologically intact on physical exam.  Imaging reveals no acute intracranial abnormality.  At this time it appears the patient likely has a component of both tension type and migraine headache causing her headache syndrome.  She will be given migraine cocktail.  She is discharged with prescriptions for Maxalt  and Fioricet should she have return of headache.  Concerning signs and symptoms for immediate return are discussed.  Otherwise follow-up primary care.. Patient is given ED precautions to return to the ED for any worsening or new symptoms.     FINAL CLINICAL IMPRESSION(S) / ED DIAGNOSES   Final diagnoses:  Acute non intractable tension-type headache  Other migraine without status migrainosus, not intractable     Rx / DC Orders   ED Discharge Orders          Ordered    rizatriptan  (MAXALT ) 5 MG tablet  Once PRN        04/14/24 1654    butalbital -acetaminophen -caffeine  (FIORICET) 50-325-40 MG tablet   Every 6 hours PRN        04/14/24 1654             Note:  This document was prepared using Dragon voice recognition software and may include unintentional dictation errors.   Annia Kilts 04/14/24 1919    Claria Crofts, MD 04/18/24 [phone]

## 2024-04-14 NOTE — ED Triage Notes (Signed)
 Patient sent over from [facility] for headache since last night; no improvement with Excedrin. History of headaches but states this is worse. Denies falling/hitting head.

## 2024-04-14 NOTE — ED Triage Notes (Signed)
 Arrives from [facility] for ED evaluation of headache since last night.

## 2024-04-22 ENCOUNTER — Emergency Department
Admission: EM | Admit: 2024-04-22 | Discharge: 2024-04-22 | Disposition: A | Discharge status: Home / Self Care | Attending: Emergency Medicine | Admitting: Emergency Medicine

## 2024-04-22 ENCOUNTER — Other Ambulatory Visit: Payer: Self-pay

## 2024-04-22 DIAGNOSIS — M62830 Muscle spasm of back: Secondary | ICD-10-CM | POA: Insufficient documentation

## 2024-04-22 DIAGNOSIS — G44209 Tension-type headache, unspecified, not intractable: Secondary | ICD-10-CM

## 2024-04-22 DIAGNOSIS — R519 Headache, unspecified: Secondary | ICD-10-CM | POA: Diagnosis present

## 2024-04-22 DIAGNOSIS — Z853 Personal history of malignant neoplasm of breast: Secondary | ICD-10-CM | POA: Diagnosis not present

## 2024-04-22 MED ORDER — KETOROLAC TROMETHAMINE 30 MG/ML IJ SOLN
INTRAMUSCULAR | Status: AC
  Filled 2024-04-22: qty 2

## 2024-04-22 MED ORDER — METHOCARBAMOL 500 MG PO TABS: 500.0000 mg | ORAL_TABLET | Freq: Three times a day (TID) | ORAL | 0 refills | Status: AC | PRN

## 2024-04-22 MED ORDER — LIDOCAINE 5 % EX PTCH
1.0000 | MEDICATED_PATCH | Freq: Once | CUTANEOUS | Status: DC
  Administered 2024-04-22: 1 via TRANSDERMAL
  Filled 2024-04-22: qty 1

## 2024-04-22 MED ORDER — KETOROLAC TROMETHAMINE 30 MG/ML IJ SOLN
60.0000 mg | Freq: Once | INTRAMUSCULAR | Status: AC
  Administered 2024-04-22: 60 mg via INTRAMUSCULAR
  Filled 2024-04-22: qty 2

## 2024-04-22 NOTE — ED Triage Notes (Signed)
 Patient C/O neck pain that began about a week ago. Patient denies any specific injury, but states that it feels like a strain and that it has been causing intermittent headaches. Patient does have full range of motion of her neck, denies any fevers, nausea or vomiting.

## 2024-04-22 NOTE — Discharge Instructions (Addendum)
You may alternate Tylenol 1000 mg every 6 hours as needed for pain, fever and Ibuprofen 800 mg every 6-8 hours as needed for pain, fever.  Please take Ibuprofen with food.  Do not take more than 4000 mg of Tylenol (acetaminophen) in a 24 hour period. ° °

## 2024-04-22 NOTE — ED Provider Notes (Signed)
 [facility] Provider Note    Event Date/Time   First MD Initiated Contact with Patient 04/22/24 [phone]     (approximate)   History   Neck Pain   HPI  Brandy Krause is a 38 y.o. female with history of migraines, multiple sclerosis who presents to the emergency department with left-sided neck pain that radiates into the posterior head causing headache, bilateral ear pain.  No head or neck injury.  No numbness, tingling or weakness.  Able to ambulate.  Recently had negative CT head 04/14/2024, MRI brain with and without contrast on 04/17/2024.  Pain is worse with turning her head.  States she is taking over-the-counter medications without relief.  States she is hoping to get a prescription for muscle relaxers.  She denies any chest pain or shortness of breath.   History provided by patient.    Past Medical History:  Diagnosis Date   At high risk for breast cancer 05/2016   IBIS = 20.6% PER mYRIAD; RECALCULATE AGE 74   BRCA negative 05/2016   MyRisk neg   Family history of colon cancer 06/30/2016   mom dx'd at 106; rec scr colonoscopies starting at age 57   History of Papanicolaou smear of cervix 10/06/14; 05/06/16   NEG, CT/GC NEG; NEG   MS (multiple sclerosis) (HCC)    Multiple sclerosis (HCC)     Past Surgical History:  Procedure Laterality Date   FOOT SURGERY Right    bunion removal   WISDOM TOOTH EXTRACTION  2005    MEDICATIONS:  Prior to Admission medications   Medication Sig Start Date End Date Taking? Authorizing Provider  methocarbamol (ROBAXIN) 500 MG tablet Take 1 tablet (500 mg total) by mouth every 8 (eight) hours as needed. 04/22/24  Yes Amran Malter, Clover Dao, DO  amphetamine -dextroamphetamine  (ADDERALL) 20 MG tablet Take 1 tablet (20 mg total) by mouth 2 (two) times daily. 03/28/24   Laneta Pintos, MD  amphetamine -dextroamphetamine  (ADDERALL) 20 MG tablet Take 1 tablet (20 mg total) by mouth 2 (two) times daily. 04/27/24   Laneta Pintos, MD   amphetamine -dextroamphetamine  (ADDERALL) 20 MG tablet Take 1 tablet (20 mg total) by mouth 2 (two) times daily. 05/27/24   Laneta Pintos, MD  atenolol  (TENORMIN ) 25 MG tablet TAKE 1/2 TABLET(12.5 MG) daily by mouth 08/25/23   Laneta Pintos, MD  butalbital -acetaminophen -caffeine  (FIORICET) 50-325-40 MG tablet Take 1 tablet by mouth every 6 (six) hours as needed for headache. 04/14/24 04/14/25  Cuthriell, Ardath Bears, PA-C  Cholecalciferol (VITAMIN D3) 50 MCG (2000 UT) capsule Take 2 capsules (4,000 Units total) by mouth daily. 03/29/24   Laneta Pintos, MD  nortriptyline (PAMELOR) 10 MG capsule Take 10 mg by mouth at bedtime. 11/18/21   [provider]  ocrelizumab  (OCREVUS ) 300 MG/10ML injection Inject into the vein.    [provider]  rizatriptan  (MAXALT ) 5 MG tablet Take 1 tablet (5 mg total) by mouth once as needed for migraine. May repeat in 2 hours if needed 04/14/24 04/15/25  Cuthriell, Ardath Bears, PA-C  Vitamin D , Ergocalciferol , (DRISDOL ) 1.25 MG (50000 UNIT) CAPS capsule Take 1 capsule (50,000 Units total) by mouth every 7 (seven) days. 12/29/23   Laneta Pintos, MD  medroxyPROGESTERone  (DEPO-PROVERA ) 150 MG/ML injection Inject 1 mL (150 mg total) into the muscle every 3 (three) months. 06/29/18 06/28/19  Schmid, Jacelyn Y, CNM    Physical Exam   Triage Vital Signs: ED Triage Vitals  Encounter Vitals Group  BP 04/22/24 0309 (!) 143/106     Systolic BP Percentile --      Diastolic BP Percentile --      Pulse Rate 04/22/24 0309 87     Resp 04/22/24 0309 18     Temp 04/22/24 0309 98 F (36.7 C)     Temp Source 04/22/24 0305 Oral     SpO2 04/22/24 0302 98 %     Weight 04/22/24 0305 190 lb (86.2 kg)     Height 04/22/24 0305 5\' 5"  (1.651 m)     Head Circumference --      Peak Flow --      Pain Score 04/22/24 0305 10     Pain Loc --      Pain Education --      Exclude from Growth Chart --     Most recent vital signs: Vitals:   04/22/24 0302 04/22/24 0309  BP:   (!) 143/106  Pulse:  87  Resp:  18  Temp:  98 F (36.7 C)  SpO2: 98% 98%    CONSTITUTIONAL: Alert, responds appropriately to questions. Well-appearing; well-nourished HEAD: Normocephalic, atraumatic EYES: Conjunctivae clear, pupils appear equal, sclera nonicteric ENT: normal nose; moist mucous membranes; TMs are clear bilaterally without erythema, purulence, bulging, perforation, effusion.  No cerumen impaction or sign of foreign body in the external auditory canal. No inflammation, erythema or drainage from the external auditory canal. No signs of mastoiditis. No pain with manipulation of the pinna bilaterally. NECK: Supple, normal ROM, no patient does have some limited range of motion with turning her head to the left.  No meningismus.  No midline spinal tenderness, step-off or deformity.  Patient is tender to palpation over the left trapezius muscle.  This muscle is tight without overlying skin changes. CARD: RRR; S1 and S2 appreciated RESP: Normal chest excursion without splinting or tachypnea; breath sounds clear and equal bilaterally; no wheezes, no rhonchi, no rales, no hypoxia or respiratory distress, speaking full sentences ABD/GI: Non-distended; soft, non-tender, no rebound, no guarding, no peritoneal signs BACK: The back appears normal EXT: Normal ROM in all joints; no deformity noted, no edema SKIN: Normal color for age and race; warm; no rash on exposed skin NEURO: Moves all extremities equally, normal speech, normal gait, normal sensation PSYCH: The patient's mood and manner are appropriate.   ED Results / Procedures / Treatments   LABS: (all labs ordered are listed, but only abnormal results are displayed) Labs Reviewed - No data to display   EKG:  EKG Interpretation Date/Time:    Ventricular Rate:    PR Interval:    QRS Duration:    QT Interval:    QTC Calculation:   R Axis:      Text Interpretation:           RADIOLOGY: My personal review and  interpretation of imaging:    I have personally reviewed all radiology reports.   No results found.   PROCEDURES:  Critical Care performed: No     Procedures    IMPRESSION / MDM / ASSESSMENT AND PLAN / ED COURSE  I reviewed the triage vital signs and the nursing notes.    Patient here with headache, left lateral neck pain.   DIFFERENTIAL DIAGNOSIS (includes but not limited to):   Trapezius muscle spasm, muscle strain, torticollis, tension headache, migraine, doubt intracranial hemorrhage, CVT, MS flare, stroke   Patient's presentation is most consistent with acute complicated illness / injury requiring diagnostic workup.   PLAN: Patient  requesting muscle relaxers which I feel is very reasonable to help treat her symptoms.  She drove herself to the emergency department therefore we will provide her with a prescription.  Will give IM Toradol , Lidoderm  patch here for symptomatic relief.  She declines a migraine cocktail.  She is neurologically intact and recently had an MRI of the brain with and without contrast that showed no new lesions for her MS.  Recommended close follow-up with her outpatient doctors.  Recommended stretching, massage, alternating heat and ice.  She is comfortable with this plan.   MEDICATIONS GIVEN IN ED: Medications  lidocaine  (LIDODERM ) 5 % 1 patch (1 patch Transdermal Patch Applied 04/22/24 0625)  ketorolac  (TORADOL ) 30 MG/ML injection (  Not Given 04/22/24 0636)  ketorolac  (TORADOL ) 30 MG/ML injection 60 mg (60 mg Intramuscular Given 04/22/24 1610)     ED COURSE:  At this time, I do not feel there is any life-threatening condition present. I reviewed all nursing notes, vitals, pertinent previous records.  All lab and urine results, EKGs, imaging ordered have been independently reviewed and interpreted by myself.  I reviewed all available radiology reports from any imaging ordered this visit.  Based on my assessment, I feel the patient is safe to be  discharged home without further emergent workup and can continue workup as an outpatient as needed. Discussed all findings, treatment plan as well as usual and customary return precautions.  They verbalize understanding and are comfortable with this plan.  Outpatient follow-up has been provided as needed.  All questions have been answered.    CONSULTS:  none   OUTSIDE RECORDS REVIEWED: Reviewed last internal medicine note on 04/14/2024.       FINAL CLINICAL IMPRESSION(S) / ED DIAGNOSES   Final diagnoses:  Spasm of left trapezius muscle  Tension headache     Rx / DC Orders   ED Discharge Orders          Ordered    methocarbamol (ROBAXIN) 500 MG tablet  Every 8 hours PRN        04/22/24 0619             Note:  This document was prepared using Dragon voice recognition software and may include unintentional dictation errors.   Dajia Gunnels, Clover Dao, DO 04/22/24 [phone]

## 2024-05-20 NOTE — Progress Notes (Signed)
 Subjective Patient ID: Brandy Krause is a 38 y.o. female.    The patient is a 38 y.o. female who presents for evaluation of burning with urination. Patient reports associated symptoms of frequency and urgency. Patient denies a history of recurrent UTI but denies a history of pyelonephritis. Patient denies fever, chills, nausea, vomiting, abdominal pain, flank pain, low back pain and other signs of systemic infection. No other c/o today.   History provided by:  Patient   Review of Systems  Constitutional:  Negative for chills, fatigue and fever.  Gastrointestinal:  Negative for abdominal pain, diarrhea, nausea and vomiting.  Genitourinary:  Positive for dysuria, frequency and urgency. Negative for flank pain, hematuria, vaginal bleeding, vaginal discharge and vaginal pain.  Musculoskeletal:  Negative for arthralgias and myalgias.    Patient History  Allergies: No Known Allergies  Past Medical History:  Diagnosis Date   Anxiety and depression    History reviewed. No pertinent surgical history. Social History   Socioeconomic History   Marital status: Single    Spouse name: Not on file   Number of children: Not on file   Years of education: Not on file   Highest education level: Not on file  Occupational History   Not on file  Tobacco Use   Smoking status: Never   Smokeless tobacco: Never  Vaping Use   Vaping status: Never Used  Substance and Sexual Activity   Alcohol use: Never   Drug use: Never   Sexual activity: Yes  Other Topics Concern   Not on file  Social History Narrative   Not on file   History reviewed. No pertinent family history. Current Outpatient Medications on File Prior to Visit  Medication Sig Dispense Refill   amphetamine -dextroamphetamine  (Adderall) 20 MG tablet Take 20 mg by mouth.     atenolol  (Tenormin ) 25 MG tablet TAKE 1/2 TABLET(12.5 MG) daily by mouth     [DISCONTINUED] buPROPion  XL (Wellbutrin  XL) 150 MG 24 hr tablet TAKE  1 TABLET(150 MG) BY MOUTH DAILY (Patient not taking: Reported on 05/20/2024)     [DISCONTINUED] busPIRone  (Buspar ) 7.5 MG tablet Take 7.5 mg by mouth. (Patient not taking: Reported on 05/20/2024)     [DISCONTINUED] diclofenac  (Voltaren ) 75 MG EC tablet  (Patient not taking: Reported on 05/20/2024)     [DISCONTINUED] docusate sodium  (Colace) 50 MG capsule Take by mouth. (Patient not taking: Reported on 05/20/2024)     [DISCONTINUED] naproxen  (Naprosyn ) 500 MG tablet Take 500 mg by mouth. (Patient not taking: Reported on 05/20/2024)     [DISCONTINUED] nortriptyline (Pamelor) 25 MG capsule Take 1 capsule by mouth every night. (Patient not taking: Reported on 05/20/2024)     [DISCONTINUED] pantoprazole (ProtoNix) 40 MG EC tablet  (Patient not taking: Reported on 05/20/2024)     No current facility-administered medications on file prior to visit.    Objective  Vitals:   05/20/24 1004  BP: (!) 141/87  Pulse: 94  Resp: 18  Temp: 37 C (98.6 F)  SpO2: 98%  Weight: 88 kg  Height: 5' 6  PainSc: 0-No pain  LMP: 05/06/2024        OBGYN/Pregnancy Status: Having periods    Physical Exam  GENERAL APPEARANCE: afebrile, non-toxic and not acutely ill-appearing in NAD. Patient is speaking in full sentences with no increased work of breathing. EYES: EOM intact, PERRLA, with no conjunctival injection noted. HEART: RRR, S1 and S2 present with no obvious murmur noted. No rubs, gallops or clicks noted. LUNGS: CTA bilaterally with no  wheezing, ronchi, rales or crackles noted.  ABDOMEN: Soft, nontender, nondistended, no hepatomegaly, no splenomegaly, flat with no palpable masses noted. No CVA tenderness noted. GU: deferred by patient  Results for orders placed or performed in visit on 05/20/24  POCT pregnancy, urine manually resulted  Component Result   Preg Test, Ur Negative   Internal Quality Control Pass  POCT urinalysis dipstick manually resulted  Component Result   Color, UA Yellow   Clarity, UA  Cloudy (A)   Glucose, UA Negative   Bilirubin, UA Negative   Ketones, UA Negative   Spec Grav, UA 1.015   Blood, UA Trace (A)   pH, UA 7.0   Protein, UA Negative   Urobilinogen, UA Negative   Leukocytes, UA 1+ (A)   Nitrite, UA Positive (A)       Procedures MDM:     1 Acute illness with systemic symptoms     Explanation of Medical Decision Making and variances from expected care:    Advised to hydrate well. Meds as ordered. Urine culture ordered. See PCP soon. F/U as needed.    Unique ordered tests: Two     Assessment requiring historian other than patient: No     Independent visualization of image, tracing, or test: No     Discussion of management with another provider: No     Risk:: Moderate          Assessment/Plan Diagnoses and all orders for this visit:  Acute cystitis with hematuria -     Urine Culture, Routine -     cephalexin  (Keflex ) 500 MG capsule; Take 1 capsule (500 mg total) by mouth in the morning and 1 capsule (500 mg total) in the evening and 1 capsule (500 mg total) before bedtime. Do all this for 7 days.  Dysuria -     POCT pregnancy, urine manually resulted -     POCT urinalysis dipstick manually resulted     Disposition Status: Home  Patient Instructions  Urine culture is ordered today. Advised the patient to hydrate well. Previous urine culture results were reviewed today and played a vital role in antibiotic selection. Advised the patient to take the entire course of antibiotics and we will only call if there is a need to change antibiotics once urine culture results are available. Advised the patient to seek care at the ED if they develop fever, chills, abdominal pain, low back pain or worsening symptoms. Follow up with PCP as scheduled.    Progress note signed by Fairy Jenny, PA on 05/20/24 at  4:36 PM

## 2024-11-28 ENCOUNTER — Other Ambulatory Visit: Payer: Self-pay | Admitting: Family Medicine

## 2024-11-28 DIAGNOSIS — F909 Attention-deficit hyperactivity disorder, unspecified type: Secondary | ICD-10-CM

## 2024-11-28 MED ORDER — AMPHETAMINE-DEXTROAMPHETAMINE 20 MG PO TABS: 20.0000 mg | ORAL_TABLET | Freq: Two times a day (BID) | ORAL | 0 refills | Status: AC

## 2024-11-28 NOTE — Telephone Encounter (Signed)
 Copied from CRM #[id number]. Topic: Clinical - Medication Refill >> Nov 28, 2024 11:34 AM Terri G wrote: Medication: amphetamine -dextroamphetamine  (ADDERALL) 20 MG tablet  Has the patient contacted their pharmacy? Yes (Agent: If no, request that the patient contact the pharmacy for the refill. If patient does not wish to contact the pharmacy document the reason why and proceed with request.) (Agent: If yes, when and what did the pharmacy advise?)  This is the patient's preferred pharmacy:  [id number], [place] - [address] AT [facility] [address] [place] [address] Phone: [phone] Fax: [phone]    Is this the correct pharmacy for this prescription? Yes If no, delete pharmacy and type the correct one.   Has the prescription been filled recently? No  Is the patient out of the medication? Yes  Has the patient been seen for an appointment in the last year OR does the patient have an upcoming appointment? No  Can we respond through MyChart? Yes  Agent: Please be advised that Rx refills may take up to 3 business days. We ask that you follow-up with your pharmacy.
